# Patient Record
Sex: Male | Born: 1975 | Race: White | Hispanic: No | Marital: Married | Smoking: Never smoker
Health system: Southern US, Community
[De-identification: ages and names within clinical notes are randomized; demographics above are authoritative.]

## PROBLEM LIST (undated history)

## (undated) DIAGNOSIS — I10 Essential (primary) hypertension: Secondary | ICD-10-CM

## (undated) DIAGNOSIS — G4733 Obstructive sleep apnea (adult) (pediatric): Secondary | ICD-10-CM

## (undated) DIAGNOSIS — I509 Heart failure, unspecified: Secondary | ICD-10-CM

## (undated) HISTORY — PX: URETHRA SURGERY: SHX824

## (undated) HISTORY — PX: OTHER SURGICAL HISTORY: SHX169

## (undated) HISTORY — PX: TONSILLECTOMY: SUR1361

---

## 1999-08-05 ENCOUNTER — Encounter: Payer: Self-pay | Admitting: Family Medicine

## 1999-08-05 ENCOUNTER — Encounter: Admission: RE | Admit: 1999-08-05 | Discharge: 1999-08-05 | Payer: Self-pay | Admitting: Family Medicine

## 2001-11-18 ENCOUNTER — Emergency Department (HOSPITAL_COMMUNITY): Admission: EM | Admit: 2001-11-18 | Discharge: 2001-11-18 | Payer: Self-pay | Admitting: Emergency Medicine

## 2004-01-08 ENCOUNTER — Emergency Department (HOSPITAL_COMMUNITY): Admission: EM | Admit: 2004-01-08 | Discharge: 2004-01-08 | Payer: Self-pay | Admitting: Family Medicine

## 2004-06-13 ENCOUNTER — Emergency Department (HOSPITAL_COMMUNITY): Admission: EM | Admit: 2004-06-13 | Discharge: 2004-06-13 | Payer: Self-pay | Admitting: Family Medicine

## 2004-06-22 ENCOUNTER — Emergency Department (HOSPITAL_COMMUNITY): Admission: EM | Admit: 2004-06-22 | Discharge: 2004-06-22 | Payer: Self-pay | Admitting: Family Medicine

## 2007-05-22 ENCOUNTER — Emergency Department (HOSPITAL_COMMUNITY): Admission: EM | Admit: 2007-05-22 | Discharge: 2007-05-22 | Payer: Self-pay | Admitting: Emergency Medicine

## 2007-10-07 ENCOUNTER — Emergency Department (HOSPITAL_COMMUNITY): Admission: EM | Admit: 2007-10-07 | Discharge: 2007-10-07 | Payer: Self-pay | Admitting: Emergency Medicine

## 2008-10-23 ENCOUNTER — Emergency Department (HOSPITAL_COMMUNITY): Admission: EM | Admit: 2008-10-23 | Discharge: 2008-10-23 | Payer: Self-pay | Admitting: Family Medicine

## 2009-10-12 ENCOUNTER — Ambulatory Visit (HOSPITAL_COMMUNITY): Admission: RE | Admit: 2009-10-12 | Discharge: 2009-10-12 | Payer: Self-pay | Admitting: Cardiology

## 2017-04-28 DIAGNOSIS — R6882 Decreased libido: Secondary | ICD-10-CM | POA: Diagnosis not present

## 2017-04-28 DIAGNOSIS — I10 Essential (primary) hypertension: Secondary | ICD-10-CM | POA: Diagnosis not present

## 2017-05-07 DIAGNOSIS — L821 Other seborrheic keratosis: Secondary | ICD-10-CM | POA: Diagnosis not present

## 2017-05-07 DIAGNOSIS — B078 Other viral warts: Secondary | ICD-10-CM | POA: Diagnosis not present

## 2017-05-07 DIAGNOSIS — D1801 Hemangioma of skin and subcutaneous tissue: Secondary | ICD-10-CM | POA: Diagnosis not present

## 2017-05-07 DIAGNOSIS — D225 Melanocytic nevi of trunk: Secondary | ICD-10-CM | POA: Diagnosis not present

## 2017-05-12 DIAGNOSIS — I10 Essential (primary) hypertension: Secondary | ICD-10-CM | POA: Diagnosis not present

## 2017-05-28 ENCOUNTER — Emergency Department (HOSPITAL_BASED_OUTPATIENT_CLINIC_OR_DEPARTMENT_OTHER): Payer: 59

## 2017-05-28 ENCOUNTER — Other Ambulatory Visit: Payer: Self-pay

## 2017-05-28 ENCOUNTER — Emergency Department (HOSPITAL_BASED_OUTPATIENT_CLINIC_OR_DEPARTMENT_OTHER)
Admission: EM | Admit: 2017-05-28 | Discharge: 2017-05-28 | Disposition: A | Discharge status: Home / Self Care | Payer: 59 | Attending: Emergency Medicine | Admitting: Emergency Medicine

## 2017-05-28 ENCOUNTER — Encounter (HOSPITAL_BASED_OUTPATIENT_CLINIC_OR_DEPARTMENT_OTHER): Payer: Self-pay

## 2017-05-28 DIAGNOSIS — R002 Palpitations: Secondary | ICD-10-CM | POA: Insufficient documentation

## 2017-05-28 DIAGNOSIS — I10 Essential (primary) hypertension: Secondary | ICD-10-CM | POA: Insufficient documentation

## 2017-05-28 DIAGNOSIS — R2242 Localized swelling, mass and lump, left lower limb: Secondary | ICD-10-CM | POA: Insufficient documentation

## 2017-05-28 DIAGNOSIS — R600 Localized edema: Secondary | ICD-10-CM | POA: Diagnosis not present

## 2017-05-28 DIAGNOSIS — M7989 Other specified soft tissue disorders: Secondary | ICD-10-CM | POA: Diagnosis not present

## 2017-05-28 HISTORY — DX: Essential (primary) hypertension: I10

## 2017-05-28 NOTE — ED Provider Notes (Addendum)
I have personally seen and examined the patient. I have reviewed the documentation on PMH/FH/Soc Hx. I have discussed the plan of care with the resident and patient.  I have reviewed and agree with the resident's documentation. Please see associated encounter note.  Briefly, the patient is a 42 y.o. male here with LLE swelling. No trauma. No h/o DVT. No travel or surgery. No evidence of infection. Exam with BLE pitting edema, L>R.  [place] w/o DVT. The patient appears reasonably screened and/or stabilized for discharge and I doubt any other medical condition or other [facility] requiring further screening, evaluation, or treatment in the ED at this time prior to discharge. The patient is safe for discharge with strict return precautions.    EKG Interpretation None         Danene Montijo, Grayce Sessions, MD 05/28/17 [phone]

## 2017-05-28 NOTE — ED Notes (Signed)
Pt verbalizes understanding of dc instructions and denies any further needs at this time

## 2017-05-28 NOTE — Discharge Instructions (Addendum)
Please schedule a follow-up appointment with your primary doctor to discuss your leg swelling. In the meantime, please purchase a pair of compression stockings to wear at least while you are on your feet during the day. Elevating your feet at the end of the day can also be helpful to improve swelling.  If your swelling worsens or becomes painful, or if you develop chest pain or shortness of breath, please return to the emergency room.

## 2017-05-28 NOTE — ED Triage Notes (Signed)
C/o swelling to left LE x 2 days-denies injury-states he was sent by PCP to r/o DVT-NAD-steady gait

## 2017-05-28 NOTE — ED Provider Notes (Signed)
[place] EMERGENCY DEPARTMENT Provider Note   CSN: [id number] Arrival date & time: 05/28/17  1539  History   Chief Complaint Chief Complaint  Patient presents with  . Leg Swelling    HPI Justin Perez is a 42 y.o. male presenting with LLE swelling.  HPI   Patient presents with LLE swelling x2d. His wife noticed two nights ago that his L lower leg was very swollen compared the right. She said it was about twice the size of the R calf. He called his PCP, who recommended he be seen in the ED to rule out DVT. Says that swelling has improved somewhat today, but is still present. Did sleep with legs elevated last night which improved swelling some. Denies erythema of LE. Denies pain except when pain was especially swollen and pressure applied. Denies preceding trauma. No history of clots. No recent extended car or plane travel. Denies difficulty breathing or pain pain. Endorses occasional palpitations but says this is nothing new. Takes HCTZ but no other medications.   Past Medical History:  Diagnosis Date  . Hypertension     There are no active problems to display for this patient.   Past Surgical History:  Procedure Laterality Date  . TONSILLECTOMY    . URETHRA SURGERY          Home Medications    Prior to Admission medications   Not on File    Family History No family history on file.  Social History Social History   Tobacco Use  . Smoking status: Never Smoker  . Smokeless tobacco: Never Used  Substance Use Topics  . Alcohol use: Yes    Comment: occ  . Drug use: Never     Allergies   Patient has no known allergies.   Review of Systems Review of Systems  Respiratory: Negative for shortness of breath.   Cardiovascular: Positive for palpitations and leg swelling (LLE). Negative for chest pain.  Skin: Negative for color change.   Physical Exam Updated Vital Signs BP (!) 147/88 (BP Location: Right Arm)   Pulse 67   Temp 98.3 F (36.8 C)  (Oral)   Resp 18   Ht 6' (1.829 m)   Wt (!) 184.6 kg (407 lb)   SpO2 95%   BMI 55.20 kg/m   Physical Exam  Constitutional: He is oriented to person, place, and time. He appears well-developed and well-nourished. No distress.  HENT:  Head: Normocephalic and atraumatic.  Nose: Nose normal.  Mouth/Throat: Oropharynx is clear and moist. No oropharyngeal exudate.  Eyes: Pupils are equal, round, and reactive to light. Conjunctivae and EOM are normal. Right eye exhibits no discharge. Left eye exhibits no discharge.  Neck: Normal range of motion.  Cardiovascular: Normal rate, regular rhythm and normal heart sounds.  No murmur heard. Pulmonary/Chest: Effort normal and breath sounds normal. No respiratory distress. He has no wheezes.  Abdominal: Soft. Bowel sounds are normal. He exhibits no distension. There is no tenderness.  Musculoskeletal:  L calf increased in diameter compared to R, though not significantly. No erythema, TTP, or palpable cords. Homan's sign neg. 2+ pitting edema to midshin with 1+ to knee noted bilaterally. Woody skin consistent with chronic venous stasis changes noted bilaterally. 5/5 strength lower extremities.   Neurological: He is alert and oriented to person, place, and time.  Skin: Skin is warm and dry.  Psychiatric: He has a normal mood and affect. His behavior is normal.  Nursing note and vitals reviewed.   ED  Treatments / Results  Labs (all labs ordered are listed, but only abnormal results are displayed) Labs Reviewed - No data to display  EKG None  Radiology US Venous Img Lower Unilateral Left  Result Date: 05/28/2017 CLINICAL DATA:  Left lower extremity swelling x3 days EXAM: LEFT LOWER EXTREMITY VENOUS DOPPLER ULTRASOUND TECHNIQUE: Gray-scale sonography with graded compression, as well as color Doppler and duplex ultrasound were performed to evaluate the lower extremity deep venous systems from the level of the common femoral vein and including the  common femoral, femoral, profunda femoral, popliteal and calf veins including the posterior tibial, peroneal and gastrocnemius veins when visible. The superficial great saphenous vein was also interrogated. Spectral Doppler was utilized to evaluate flow at rest and with distal augmentation maneuvers in the common femoral, femoral and popliteal veins. COMPARISON:  None. FINDINGS: Contralateral Common Femoral Vein: Respiratory phasicity is normal and symmetric with the symptomatic side. No evidence of thrombus. Normal compressibility. Common Femoral Vein: No evidence of thrombus. Normal compressibility, respiratory phasicity and response to augmentation. Saphenofemoral Junction: No evidence of thrombus. Normal compressibility and flow on color Doppler imaging. Profunda Femoral Vein: No evidence of thrombus. Normal compressibility and flow on color Doppler imaging. Femoral Vein: No evidence of thrombus. Normal compressibility, respiratory phasicity and response to augmentation. Popliteal Vein: No evidence of thrombus. Normal compressibility, respiratory phasicity and response to augmentation. Calf Veins: No evidence of thrombus. Normal compressibility and flow on color Doppler imaging. Peroneal veins however not visualized. Superficial Great Saphenous Vein: No evidence of thrombus. Normal compressibility. Venous Reflux:  None. Other Findings:  None. IMPRESSION: No evidence of deep venous thrombosis. Electronically Signed   By: Ashley Royalty M.D.   On: 05/28/2017 18:07    Procedures Procedures (including critical care time)  Medications Ordered in ED Medications - No data to display   Initial Impression / Assessment and Plan / ED Course  I have reviewed the triage vital signs and the nursing notes.  Pertinent labs & imaging results that were available during my care of the patient were reviewed by me and considered in my medical decision making (see chart for details).     1752 Patient presenting with LLE  swelling with possible DVT. Venous stasis also included in differential, as patient with no risk factors for DVT, significant pitting edema bilaterally, neg Homan's sign, palpable cords, or TTP, and normal WOB. Initially tachypneic on presentation, however normal WOB on physical exam with normal RR and clear lungs bilaterally. Bilateral LE do have skin changes consistent with venous stasis, which could explain swelling with pitting edema. Would also explain improvement of sx with leg elevation. Will await read of lower extremity Doppler US.   1813 LE Doppler US resulted with no evidence of DVT. Swelling most likely 2/2 gravity-dependent edema/venous stasis. Will encourage patient to f/u with PCP. Return precautions discussed.   Final Clinical Impressions(s) / ED Diagnoses   Final diagnoses:  Leg swelling    ED Discharge Orders    None     Adin Hector, MD, MPH PGY-3 [facility] Pager [phone]    Verner Mould, MD 05/28/17 [phone]

## 2017-06-02 DIAGNOSIS — R208 Other disturbances of skin sensation: Secondary | ICD-10-CM | POA: Diagnosis not present

## 2017-06-02 DIAGNOSIS — M7989 Other specified soft tissue disorders: Secondary | ICD-10-CM | POA: Diagnosis not present

## 2017-06-02 DIAGNOSIS — I10 Essential (primary) hypertension: Secondary | ICD-10-CM | POA: Diagnosis not present

## 2017-06-17 DIAGNOSIS — G4733 Obstructive sleep apnea (adult) (pediatric): Secondary | ICD-10-CM | POA: Diagnosis not present

## 2017-07-07 DIAGNOSIS — G4733 Obstructive sleep apnea (adult) (pediatric): Secondary | ICD-10-CM | POA: Diagnosis not present

## 2017-07-14 DIAGNOSIS — G4733 Obstructive sleep apnea (adult) (pediatric): Secondary | ICD-10-CM | POA: Diagnosis not present

## 2017-08-14 DIAGNOSIS — G4733 Obstructive sleep apnea (adult) (pediatric): Secondary | ICD-10-CM | POA: Diagnosis not present

## 2017-09-08 DIAGNOSIS — G4733 Obstructive sleep apnea (adult) (pediatric): Secondary | ICD-10-CM | POA: Diagnosis not present

## 2017-09-13 DIAGNOSIS — G4733 Obstructive sleep apnea (adult) (pediatric): Secondary | ICD-10-CM | POA: Diagnosis not present

## 2017-10-07 DIAGNOSIS — G4733 Obstructive sleep apnea (adult) (pediatric): Secondary | ICD-10-CM | POA: Diagnosis not present

## 2017-10-14 DIAGNOSIS — G4733 Obstructive sleep apnea (adult) (pediatric): Secondary | ICD-10-CM | POA: Diagnosis not present

## 2017-11-14 DIAGNOSIS — G4733 Obstructive sleep apnea (adult) (pediatric): Secondary | ICD-10-CM | POA: Diagnosis not present

## 2017-12-14 DIAGNOSIS — G4733 Obstructive sleep apnea (adult) (pediatric): Secondary | ICD-10-CM | POA: Diagnosis not present

## 2018-01-14 DIAGNOSIS — G4733 Obstructive sleep apnea (adult) (pediatric): Secondary | ICD-10-CM | POA: Diagnosis not present

## 2018-02-13 DIAGNOSIS — G4733 Obstructive sleep apnea (adult) (pediatric): Secondary | ICD-10-CM | POA: Diagnosis not present

## 2018-03-16 DIAGNOSIS — G4733 Obstructive sleep apnea (adult) (pediatric): Secondary | ICD-10-CM | POA: Diagnosis not present

## 2018-03-23 DIAGNOSIS — M76822 Posterior tibial tendinitis, left leg: Secondary | ICD-10-CM | POA: Diagnosis not present

## 2018-03-23 DIAGNOSIS — M722 Plantar fascial fibromatosis: Secondary | ICD-10-CM | POA: Diagnosis not present

## 2018-03-23 DIAGNOSIS — M7732 Calcaneal spur, left foot: Secondary | ICD-10-CM | POA: Diagnosis not present

## 2018-03-30 DIAGNOSIS — M722 Plantar fascial fibromatosis: Secondary | ICD-10-CM | POA: Diagnosis not present

## 2018-04-16 DIAGNOSIS — G4733 Obstructive sleep apnea (adult) (pediatric): Secondary | ICD-10-CM | POA: Diagnosis not present

## 2018-07-14 ENCOUNTER — Emergency Department (HOSPITAL_COMMUNITY): Payer: 59

## 2018-07-14 ENCOUNTER — Inpatient Hospital Stay (HOSPITAL_COMMUNITY): Payer: 59

## 2018-07-14 ENCOUNTER — Encounter (HOSPITAL_COMMUNITY): Payer: Self-pay | Admitting: Internal Medicine

## 2018-07-14 ENCOUNTER — Inpatient Hospital Stay (HOSPITAL_COMMUNITY)
Admission: EM | Admit: 2018-07-14 | Discharge: 2018-07-17 | DRG: 304 | Disposition: A | Discharge status: Home / Self Care | Payer: 59 | Attending: Internal Medicine | Admitting: Internal Medicine

## 2018-07-14 DIAGNOSIS — Z833 Family history of diabetes mellitus: Secondary | ICD-10-CM

## 2018-07-14 DIAGNOSIS — Z8249 Family history of ischemic heart disease and other diseases of the circulatory system: Secondary | ICD-10-CM | POA: Diagnosis not present

## 2018-07-14 DIAGNOSIS — R739 Hyperglycemia, unspecified: Secondary | ICD-10-CM | POA: Diagnosis present

## 2018-07-14 DIAGNOSIS — Z9989 Dependence on other enabling machines and devices: Secondary | ICD-10-CM

## 2018-07-14 DIAGNOSIS — Z825 Family history of asthma and other chronic lower respiratory diseases: Secondary | ICD-10-CM | POA: Diagnosis not present

## 2018-07-14 DIAGNOSIS — R042 Hemoptysis: Secondary | ICD-10-CM | POA: Diagnosis present

## 2018-07-14 DIAGNOSIS — Z20828 Contact with and (suspected) exposure to other viral communicable diseases: Secondary | ICD-10-CM | POA: Diagnosis present

## 2018-07-14 DIAGNOSIS — I5033 Acute on chronic diastolic (congestive) heart failure: Secondary | ICD-10-CM | POA: Diagnosis present

## 2018-07-14 DIAGNOSIS — J9601 Acute respiratory failure with hypoxia: Secondary | ICD-10-CM | POA: Diagnosis present

## 2018-07-14 DIAGNOSIS — I110 Hypertensive heart disease with heart failure: Secondary | ICD-10-CM | POA: Diagnosis present

## 2018-07-14 DIAGNOSIS — R778 Other specified abnormalities of plasma proteins: Secondary | ICD-10-CM | POA: Diagnosis present

## 2018-07-14 DIAGNOSIS — J810 Acute pulmonary edema: Secondary | ICD-10-CM | POA: Diagnosis not present

## 2018-07-14 DIAGNOSIS — Z6841 Body Mass Index (BMI) 40.0 and over, adult: Secondary | ICD-10-CM | POA: Diagnosis not present

## 2018-07-14 DIAGNOSIS — G4733 Obstructive sleep apnea (adult) (pediatric): Secondary | ICD-10-CM | POA: Diagnosis present

## 2018-07-14 DIAGNOSIS — R7989 Other specified abnormal findings of blood chemistry: Secondary | ICD-10-CM | POA: Diagnosis present

## 2018-07-14 DIAGNOSIS — R079 Chest pain, unspecified: Secondary | ICD-10-CM | POA: Diagnosis not present

## 2018-07-14 DIAGNOSIS — R0602 Shortness of breath: Secondary | ICD-10-CM

## 2018-07-14 DIAGNOSIS — I160 Hypertensive urgency: Secondary | ICD-10-CM | POA: Diagnosis not present

## 2018-07-14 DIAGNOSIS — Z20822 Contact with and (suspected) exposure to covid-19: Secondary | ICD-10-CM | POA: Diagnosis present

## 2018-07-14 DIAGNOSIS — R6889 Other general symptoms and signs: Secondary | ICD-10-CM | POA: Diagnosis not present

## 2018-07-14 DIAGNOSIS — I10 Essential (primary) hypertension: Secondary | ICD-10-CM | POA: Diagnosis present

## 2018-07-14 DIAGNOSIS — J9600 Acute respiratory failure, unspecified whether with hypoxia or hypercapnia: Secondary | ICD-10-CM

## 2018-07-14 HISTORY — DX: Morbid (severe) obesity due to excess calories: E66.01

## 2018-07-14 HISTORY — DX: Obstructive sleep apnea (adult) (pediatric): G47.33

## 2018-07-14 LAB — BASIC METABOLIC PANEL
Anion gap: 11 (ref 5–15)
BUN: 14 mg/dL (ref 6–20)
CO2: 26 mmol/L (ref 22–32)
Calcium: 9.1 mg/dL (ref 8.9–10.3)
Chloride: 103 mmol/L (ref 98–111)
Creatinine, Ser: 1.11 mg/dL (ref 0.61–1.24)
GFR calc Af Amer: >60 mL/min (ref >60)
GFR calc non Af Amer: >60 mL/min (ref >60)
Glucose, Bld: 130 mg/dL — ABNORMAL HIGH (ref 70–99)
Potassium: 4.2 mmol/L (ref 3.5–5.1)
Sodium: 140 mmol/L (ref 135–145)

## 2018-07-14 LAB — CBC WITH DIFFERENTIAL/PLATELET
Abs Immature Granulocytes: 0.03 10*3/uL (ref 0.00–0.07)
Basophils Absolute: 0 10*3/uL (ref 0.0–0.1)
Basophils Relative: 0 %
Eosinophils Absolute: 0.2 10*3/uL (ref 0.0–0.5)
Eosinophils Relative: 2 %
HCT: 52.2 % — ABNORMAL HIGH (ref 39.0–52.0)
Hemoglobin: 17.5 g/dL — ABNORMAL HIGH (ref 13.0–17.0)
Immature Granulocytes: 0 %
Lymphocytes Relative: 29 %
Lymphs Abs: 2.7 10*3/uL (ref 0.7–4.0)
MCH: 31.3 pg (ref 26.0–34.0)
MCHC: 33.5 g/dL (ref 30.0–36.0)
MCV: 93.2 fL (ref 80.0–100.0)
Monocytes Absolute: 0.7 10*3/uL (ref 0.1–1.0)
Monocytes Relative: 7 %
Neutro Abs: 5.6 10*3/uL (ref 1.7–7.7)
Neutrophils Relative %: 62 %
Platelets: 223 10*3/uL (ref 150–400)
RBC: 5.6 MIL/uL (ref 4.22–5.81)
RDW: 13.6 % (ref 11.5–15.5)
WBC: 9.2 10*3/uL (ref 4.0–10.5)
nRBC: 0 % (ref 0.0–0.2)

## 2018-07-14 LAB — SARS CORONAVIRUS 2 BY RT PCR (HOSPITAL ORDER, PERFORMED IN ~~LOC~~ HOSPITAL LAB)
SARS Coronavirus 2: NEGATIVE
SARS Coronavirus 2: NEGATIVE

## 2018-07-14 LAB — HEMOGLOBIN A1C
Hgb A1c MFr Bld: 5.5 % (ref 4.8–5.6)
Mean Plasma Glucose: 111.15 mg/dL

## 2018-07-14 LAB — HEPATIC FUNCTION PANEL
ALT: 74 U/L — ABNORMAL HIGH (ref 0–44)
AST: 58 U/L — ABNORMAL HIGH (ref 15–41)
Albumin: 3.5 g/dL (ref 3.5–5.0)
Alkaline Phosphatase: 83 U/L (ref 38–126)
Bilirubin, Direct: 0.3 mg/dL — ABNORMAL HIGH (ref 0.0–0.2)
Indirect Bilirubin: 0.8 mg/dL (ref 0.3–0.9)
Total Bilirubin: 1.1 mg/dL (ref 0.3–1.2)
Total Protein: 6.1 g/dL — ABNORMAL LOW (ref 6.5–8.1)

## 2018-07-14 LAB — RAPID URINE DRUG SCREEN, HOSP PERFORMED
Amphetamines: NOT DETECTED
Barbiturates: NOT DETECTED
Benzodiazepines: NOT DETECTED
Cocaine: NOT DETECTED
Opiates: NOT DETECTED
Tetrahydrocannabinol: NOT DETECTED

## 2018-07-14 LAB — GLUCOSE, CAPILLARY
Glucose-Capillary: 126 mg/dL — ABNORMAL HIGH (ref 70–99)
Glucose-Capillary: 102 mg/dL — ABNORMAL HIGH (ref 70–99)

## 2018-07-14 LAB — TROPONIN I
Troponin I: 0.22 ng/mL (ref <0.03)
Troponin I: 0.18 ng/mL (ref <0.03)
Troponin I: 0.22 ng/mL (ref <0.03)

## 2018-07-14 LAB — BRAIN NATRIURETIC PEPTIDE: B Natriuretic Peptide: 255.2 pg/mL — ABNORMAL HIGH (ref 0.0–100.0)

## 2018-07-14 LAB — PROCALCITONIN: Procalcitonin: <0.1 ng/mL

## 2018-07-14 LAB — TSH: TSH: 1.497 u[IU]/mL (ref 0.350–4.500)

## 2018-07-14 LAB — LACTATE DEHYDROGENASE: LDH: 259 U/L — ABNORMAL HIGH (ref 98–192)

## 2018-07-14 LAB — FIBRINOGEN: Fibrinogen: 378 mg/dL (ref 210–475)

## 2018-07-14 LAB — CBG MONITORING, ED: Glucose-Capillary: 100 mg/dL — ABNORMAL HIGH (ref 70–99)

## 2018-07-14 LAB — FERRITIN: Ferritin: 120 ng/mL (ref 24–336)

## 2018-07-14 LAB — D-DIMER, QUANTITATIVE: D-Dimer, Quant: 0.88 ug/mL-FEU — ABNORMAL HIGH (ref 0.00–0.50)

## 2018-07-14 LAB — ECHOCARDIOGRAM COMPLETE

## 2018-07-14 LAB — C-REACTIVE PROTEIN: CRP: 1.4 mg/dL — ABNORMAL HIGH (ref <1.0)

## 2018-07-14 MED ORDER — ALBUTEROL SULFATE HFA 108 (90 BASE) MCG/ACT IN AERS
2.0000 | INHALATION_SPRAY | Freq: Once | RESPIRATORY_TRACT | Status: AC
  Administered 2018-07-14: 2 via RESPIRATORY_TRACT
  Filled 2018-07-14: qty 6.7

## 2018-07-14 MED ORDER — ONDANSETRON HCL 4 MG/2ML IJ SOLN
4.0000 mg | Freq: Four times a day (QID) | INTRAMUSCULAR | Status: DC | PRN
  Filled 2018-07-14: qty 2

## 2018-07-14 MED ORDER — ACETAMINOPHEN 325 MG PO TABS
650.0000 mg | ORAL_TABLET | ORAL | Status: DC | PRN
  Administered 2018-07-14 – 2018-07-15 (×5): 650 mg via ORAL
  Filled 2018-07-14 (×6): qty 2

## 2018-07-14 MED ORDER — SODIUM CHLORIDE 0.9 % IV SOLN
250.0000 mL | INTRAVENOUS | Status: DC | PRN
  Administered 2018-07-15: 250 mL via INTRAVENOUS

## 2018-07-14 MED ORDER — SODIUM CHLORIDE 0.9% FLUSH: 3.0000 mL | INTRAVENOUS | Status: DC | PRN

## 2018-07-14 MED ORDER — ASPIRIN 300 MG RE SUPP: 300.0000 mg | RECTAL | Status: AC

## 2018-07-14 MED ORDER — ATORVASTATIN CALCIUM 40 MG PO TABS
40.0000 mg | ORAL_TABLET | Freq: Every day | ORAL | Status: DC
  Administered 2018-07-14 – 2018-07-16 (×3): 40 mg via ORAL
  Filled 2018-07-14 (×3): qty 1

## 2018-07-14 MED ORDER — NITROGLYCERIN 0.4 MG SL SUBL: 0.4000 mg | SUBLINGUAL_TABLET | SUBLINGUAL | Status: DC | PRN

## 2018-07-14 MED ORDER — AEROCHAMBER PLUS FLO-VU LARGE MISC
1.0000 | Freq: Once | Status: AC
  Administered 2018-07-14: 08:00:00 1
  Filled 2018-07-14 (×3): qty 1

## 2018-07-14 MED ORDER — ENOXAPARIN SODIUM 40 MG/0.4ML ~~LOC~~ SOLN
40.0000 mg | SUBCUTANEOUS | Status: DC
  Administered 2018-07-14 – 2018-07-16 (×3): 40 mg via SUBCUTANEOUS
  Filled 2018-07-14 (×3): qty 0.4

## 2018-07-14 MED ORDER — ALBUTEROL SULFATE HFA 108 (90 BASE) MCG/ACT IN AERS
4.0000 | INHALATION_SPRAY | Freq: Once | RESPIRATORY_TRACT | Status: AC
  Administered 2018-07-14: 4 via RESPIRATORY_TRACT

## 2018-07-14 MED ORDER — IOHEXOL 350 MG/ML SOLN
100.0000 mL | Freq: Once | INTRAVENOUS | Status: AC | PRN
  Administered 2018-07-14: 100 mL via INTRAVENOUS

## 2018-07-14 MED ORDER — HYDRALAZINE HCL 20 MG/ML IJ SOLN
5.0000 mg | Freq: Once | INTRAMUSCULAR | Status: AC
  Administered 2018-07-14: 5 mg via INTRAVENOUS
  Filled 2018-07-14: qty 1

## 2018-07-14 MED ORDER — ASPIRIN 81 MG PO CHEW
324.0000 mg | CHEWABLE_TABLET | ORAL | Status: AC
  Administered 2018-07-14: 324 mg via ORAL
  Filled 2018-07-14: qty 4

## 2018-07-14 MED ORDER — SODIUM CHLORIDE 0.9% FLUSH
3.0000 mL | Freq: Two times a day (BID) | INTRAVENOUS | Status: DC
  Administered 2018-07-14 – 2018-07-17 (×5): 3 mL via INTRAVENOUS

## 2018-07-14 MED ORDER — PERFLUTREN LIPID MICROSPHERE
1.0000 mL | INTRAVENOUS | Status: AC | PRN
  Administered 2018-07-14: 4 mL via INTRAVENOUS
  Filled 2018-07-14: qty 10

## 2018-07-14 MED ORDER — INSULIN ASPART 100 UNIT/ML ~~LOC~~ SOLN
0.0000 [IU] | Freq: Three times a day (TID) | SUBCUTANEOUS | Status: DC
  Administered 2018-07-14: 2 [IU] via SUBCUTANEOUS

## 2018-07-14 MED ORDER — ASPIRIN EC 81 MG PO TBEC
81.0000 mg | DELAYED_RELEASE_TABLET | Freq: Every day | ORAL | Status: DC
  Administered 2018-07-15 – 2018-07-16 (×2): 81 mg via ORAL
  Filled 2018-07-14 (×2): qty 1

## 2018-07-14 MED ORDER — ALBUTEROL SULFATE HFA 108 (90 BASE) MCG/ACT IN AERS
4.0000 | INHALATION_SPRAY | RESPIRATORY_TRACT | Status: DC | PRN
  Administered 2018-07-14 – 2018-07-15 (×3): 4 via RESPIRATORY_TRACT
  Filled 2018-07-14: qty 6.7

## 2018-07-14 NOTE — Progress Notes (Signed)
  Echocardiogram 2D Echocardiogram has been performed.  Justin Perez 07/14/2018, 12:22 PM

## 2018-07-14 NOTE — ED Notes (Signed)
Per pt, he wasn't feeling so well around 0830 pm last night.  States acute onset sob that awoke him from his sleep.  He initially called 911, but then, after sitting upright, felt like he could take adequate breaths.  Denies fevers.  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

## 2018-07-14 NOTE — ED Notes (Signed)
Called Pharmacy for Aerochamber

## 2018-07-14 NOTE — ED Notes (Signed)
Called [facility], swab is in the process and "should not be too long."

## 2018-07-14 NOTE — Progress Notes (Signed)
Transferred to rm 15 per bed since pt is Covid neg x2. Report given earlier to [place] RN CN who is assuming care.

## 2018-07-14 NOTE — Progress Notes (Signed)
Msg sent to Dr. Baltazar Najjar pt's 2nd Covid test was negative. Dr. Baltazar Najjar at desk. Informed Danielle RN CN about the negative Covid test and the text to Dr.Kirby about the elevated BP.

## 2018-07-14 NOTE — Progress Notes (Signed)
Msg sent to Dr. Baltazar Najjar pt's BP is now SBP in  the 140's over low 100's x4 but around 1700 he was 129/66.

## 2018-07-14 NOTE — ED Notes (Signed)
Attempted report.  4E does not take covid r/o patients.  Bed control notified.

## 2018-07-14 NOTE — ED Notes (Signed)
PT stating increasing sob, though vs remain the same.  MD notified and at bedside.  MD aware of elevated troponin.

## 2018-07-14 NOTE — ED Notes (Signed)
Patient transported to CT 

## 2018-07-14 NOTE — H&P (Signed)
History and Physical    Justin Perez PXT:[id number] DOB: [birth year] DOA: 07/14/2018  PCP: London Pepper, MD Consultants:  None Patient coming from:  Home - lives with wife; NOK: Wife, [phone]  Chief Complaint: SOB  HPI: Justin Perez is a 43 y.o. male with medical history significant of HTN; OSA on CPAP; and morbid obesity (BMI 55) presenting with SOB.   He reports having left-sided chest tightness about a week ago that last for 10 minutes and resolved spontaneously; this was similar to chest pain about 10 years that resulted in a negative cath.  His symptoms returned yesterday and were not clearly exertional in nature.  His chest tightness was associated with SOB.  Overnight, he developed markedly increased WOB and was associated with cough and hemoptysis.  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  Initial O2 sats 77% on RA and so he was placed on NRB.  Prior to my evaluating the patient, he was complaining of increased SOB and was given 4 puffs of Albuterol with improvement.  He denies h/o smoking and denies h/o known lung disease.   ED Course:  "Positive he's got COVID" with negative test, very likely false negative.  Troponin 0.22, EKG not acute.  BNP 255.2.  O2 requirement, on NRB.    Review of Systems: As per HPI; otherwise review of systems reviewed and negative.   Ambulatory Status:  Ambulates without assistance  Past Medical History:  Diagnosis Date  . Hypertension   . Morbid obesity with BMI of 50.0-59.9, adult ([place])   . OSA on CPAP     Past Surgical History:  Procedure Laterality Date  . TONSILLECTOMY    . URETHRA SURGERY      Social History   Socioeconomic History  . Marital status: Married    Spouse name: Not on file  . Number of children: Not on file  . Years of education:  Not on file  . Highest education level: Not on file  Occupational History  . Not on file  Social Needs  . Financial resource strain: Not on file  . Food insecurity:    Worry: Not on file    Inability: Not on file  . Transportation needs:    Medical: Not on file    Non-medical: Not on file  Tobacco Use  . Smoking status: Never Smoker  . Smokeless tobacco: Never Used  Substance and Sexual Activity  . Alcohol use: Yes    Comment: occ  . Drug use: Never  . Sexual activity: Not on file  Lifestyle  . Physical activity:    Days per week: Not on file    Minutes per session: Not on file  . Stress: Not on file  Relationships  . Social connections:    Talks on phone: Not on file    Gets together: Not on file    Attends religious service: Not on file    Active member of club or organization: Not on file    Attends meetings of clubs or organizations: Not on file    Relationship status: Not on file  . Intimate partner violence:    Fear of current or ex partner: Not on file    Emotionally abused: Not on file    Physically abused: Not on file    Forced sexual activity: Not  on file  Other Topics Concern  . Not on file  Social History Narrative  . Not on file    No Known Allergies  Family History  Problem Relation Age of Onset  . Heart failure Mother 38  . Diabetes Mother   . COPD Father     Prior to Admission medications   Not on File    Physical Exam: Vitals:   07/14/18 0815 07/14/18 0830 07/14/18 0845 07/14/18 0900  BP: 95/61 (!) 90/51 117/74 (!) 94/52  Pulse: (!) 106 97 88 91  Resp: (!) 28 16 (!) 31 14  Temp:      TempSrc:      SpO2: 95% 96% 93% 96%     . General:  Appears calm and comfortable and is NAD on NRB O2 at 15L . Eyes:  PERRL, EOMI, normal lids, iris . ENT:  grossly normal hearing, lips & tongue, mmm; appropriate dentition . Neck:  no LAD, masses or thyromegaly . Cardiovascular:  RRR, no m/r/g. No LE edema.  Marland Kitchen Respiratory:   CTA bilaterally with  no wheezes/rales/rhonchi.  Normal respiratory effort. . Abdomen:  soft, NT, ND, NABS . Back:   normal alignment, no CVAT . Skin:  no rash or induration seen on limited exam . Musculoskeletal:  grossly normal tone BUE/BLE, good ROM, no bony abnormality . Lower extremity:  No LE edema.  Limited foot exam with no ulcerations.  2+ distal pulses. Marland Kitchen Psychiatric:  grossly normal mood and affect, speech fluent and appropriate, AOx3 . Neurologic:  CN 2-12 grossly intact, moves all extremities in coordinated fashion, sensation intact    Radiological Exams on Admission: Ct Angio Chest Pe W And/or Wo Contrast  Result Date: 07/14/2018 CLINICAL DATA:  Chest tightness.  PE suspected. EXAM: CT ANGIOGRAPHY CHEST WITH CONTRAST TECHNIQUE: Multidetector CT imaging of the chest was performed using the standard protocol during bolus administration of intravenous contrast. Multiplanar CT image reconstructions and MIPs were obtained to evaluate the vascular anatomy. CONTRAST:  157m OMNIPAQUE IOHEXOL 350 MG/ML SOLN COMPARISON:  Radiography from earlier today FINDINGS: Cardiovascular: Borderline heart size. No pericardial effusion. Negative for pulmonary embolism. No atherosclerotic calcification. Mediastinum/Nodes: Negative for adenopathy or mass. Lungs/Pleura: Bilateral airspace opacity with perihilar predilection. There is a degree of septal thickening best seen at the bases. Trace, if any, pleural fluid. Upper Abdomen: Negative Musculoskeletal: Spondylosis and disc narrowing. Review of the MIP images confirms the above findings. IMPRESSION: 1. Bilateral airspace disease and septal thickening which could be multifocal pneumonia or edema. 2. Negative for pulmonary embolism. Electronically Signed   By: JMonte FantasiaM.D.   On: 07/14/2018 07:38   Dg Chest Port 1 View  Result Date: 07/14/2018 CLINICAL DATA:  Shortness of breath and cough today EXAM: PORTABLE CHEST 1 VIEW COMPARISON:  05/22/2007 FINDINGS: Cardiac shadow is  mildly prominent but accentuated by the frontal technique. Some atelectatic changes are noted in the right base which improved somewhat on the second image. No other focal abnormality is noted. IMPRESSION: Mild right basilar atelectasis. Electronically Signed   By: MInez CatalinaM.D.   On: 07/14/2018 03:57    EKG: Independently reviewed.  Sinus tachycardia with rate 102; nonspecific ST changes with no evidence of acute ischemia   Labs on Admission: I have personally reviewed the available labs and imaging studies at the time of the admission.  Pertinent labs:   Glucose 130 BNP 255.2 Troponin 0.22 WBC 9.2 Hgb 17.5 COVID negative  Assessment/Plan Principal Problem:   Acute respiratory failure  with hypoxia [facility]) Active Problems:   Chest pain in adult   Suspected Covid-19 Virus Infection   Essential hypertension   Elevated troponin   Morbid obesity with BMI of 50.0-59.9, adult ([facility])   OSA on CPAP   Hyperglycemia   Acute respiratory failure with hypoxia, suspected COVID-19 infection -Patient with presenting with SOB and hemoptysis as well as left-sided chest tightness -Possible indirect contact for COVID with coworkers -He does not have a usual home O2 requirement and is currently requiring NRB [place] O2 -Because there is a possible COVID contact in this patient with a new O2 requirement, will consider at high risk for COVID at this time -The patient has comorbidities which may increase the risk for ARDS/MODS including:  HTN, obesity -Exam is concerning for development of ARDS/MODS due to respiratory distress requiring NRB O2 -Pertinent labs concerning for COVID include normal WBC count; other tests looking for COVID markers have been ordered -CXR with R basilar atelectasis; CT chest showed bilateral airspace disease and septal thickening which could be multifocal pneumonia or edema - clearly concerning for COVID infection -Will hold antibiotics for now, pending procalcitonin -Will admit  for further evaluation, close monitoring, and treatment -Monitor on telemetry x at least 24 hours -At this time, will attempt to avoid use of aerosolized medications and use HFAs instead -Will check daily labs including BMP with Mag, Phos; LFTs; CBC with differential -Will ask the patient to maintain an awake prone position for 16+ hours a day, if possible, with a minimum of 2-3 hours at a time -If the patient shows clinical deterioration, consider Solumedrol 40-80 mg IV q8h x 3 days and possibly transfer to ICU with PCCM consultation -Given high-risk for ARDS/MODS, will use high-flow [place] up to 15L in a negative pressure room for now and consider PCCM consultation if not improving -Possible therapeutic interventions include hydroxychloroquine; IL-6 inhibitor (Tocilizumab, 8 mg/kg IV x 1 dose); and possibly Azithromycin, vitamin C, zinc -Will attempt to maintain euvolemia to a net negative fluid status -If D-dimer <5, will use standard-dosed Lovenox for DVT prevention -If D-dimer >5, will use weight-based Lovenox prophylaxis (0.5 mg/kg q12h) -Patient was seen wearing full PPE including: gown, gloves, head cover, N95, and face shield; donning and doffing was in compliance with current standards.  Chest pain with elevated troponin, concerning for NSTEMI -Patient with multiple RF for ACS -Reports prior h/o chest pain about 10 years ago with negative cath at that time -This pain was similar to that pain and occurred 1 week ago briefly and then again overnight -EKG without clearly acute ischemia -Troponin elevated to 0.22 -Will continue to trend troponin -Will give ASA 325 mg PO now and then 81 mg PO daily -No beta blocker due to relative hypotension in the ER -Start empiric Lipitor 40 mg daily for now -Repeat EKG prn and in AM -Check stat Echo -Given concern for possible COVID infection, will attempt to limit exposure to the patient at this time - but I have notified [employer] about the patient  and will plan to have cardiology see him tomorrow unless troponin/echo requires more urgent evaluation -Will check A1c, FLP, TSH, and UDS  HTN -The Med Rec is still pending for this patient, but there is no apparent medication listed that he has been taking for this issue -Consider beta blocker when BP will tolerate  Morbid obesity -Needs nutrition consult prior to d/c -Needs significant encouragement at weight loss; suggest outpatient consideration of bariatric clinic and/or bariatric surgery consultation  OSA on CPAP -Will need CPAP added at night once ruled out for COVID  Hyperglycemia -Certainly, with his habitus he is at risk for DM -Will check A1c -Will start moderate-scale SSI    DVT prophylaxis:  Lovenox  Code Status:  Full - confirmed with patient Family Communication: None present; I have spoken to his wife by telephone Disposition Plan:  Home once clinically improved Consults called: None  Admission status: Admit - It is my clinical opinion that admission to INPATIENT is reasonable and necessary because of the expectation that this patient will require hospital care that crosses at least 2 midnights to treat this condition based on the medical complexity of the problems presented.  Given the aforementioned information, the predictability of an adverse outcome is felt to be significant.     Karmen Bongo MD [facility]   How to contact the [facility] Attending or Consulting provider [place] or covering provider during after hours [place], for this patient?  1. Check the care team in [facility] and look for a) attending/consulting [facility] provider listed and b) the [facility] team listed 2. Log into [email or web address] and use [place]'s universal password to access. If you do not have the password, please contact the hospital operator. 3. Locate the [facility] provider you are looking for under [facility] and page to a number that you can be directly reached. 4. If you still have  difficulty reaching the provider, please page the [facility] (Director on Call) for the Hospitalists listed on amion for assistance.   07/14/2018, 10:33 AM

## 2018-07-14 NOTE — Progress Notes (Signed)
When I entered rm to assess pt and retake his bld pressure he had his NRB mask off and sats were 98% and in no apparent distress. Oxygen placed on 4 L/M [place] and pt's oxygen level greater than 98%. Will continue to try to wean pt off oxygen

## 2018-07-14 NOTE — ED Notes (Signed)
ED TO INPATIENT HANDOFF REPORT  ED Nurse Name and Phone #:  Hassan Rowan [phone]  S Name/Age/Gender Justin Perez 43 y.o. male Room/Bed: RESUSC/RESUSC  Code Status   Code Status: Full Code  Home/SNF/Other Home Patient oriented to: self, place, time and situation Is this baseline? Yes   Triage Complete: Triage complete  Chief Complaint shob  Triage Note No notes on file   Allergies No Known Allergies  Level of Care/Admitting Diagnosis ED Disposition    ED Disposition Condition [place] [facility] Area: [place] [100100]  Level of Care: Progressive [102]  Covid Evaluation: Person Under Investigation (PUI)  Isolation Risk Level: High Risk/Airborne (Aerosolizing procedure, nebulizer, intubated/ventilation, CPAP/BiPAP)  Diagnosis: Acute respiratory failure with hypoxia [facility]) [[id number]]  Admitting Physician: Karmen Bongo [[id number]]  Attending Physician: Karmen Bongo [[id number]]  Estimated length of stay: 3 - 4 days  Certification:: I certify this patient will need inpatient services for at least 2 midnights  PT Class (Do Not Modify): Inpatient [101]  PT Acc Code (Do Not Modify): Private [1]       B Medical/Surgery History Past Medical History:  Diagnosis Date  . Hypertension   . Morbid obesity with BMI of 50.0-59.9, adult ([place])   . OSA on CPAP    Past Surgical History:  Procedure Laterality Date  . TONSILLECTOMY    . URETHRA SURGERY       A IV Location/Drains/Wounds Patient Lines/Drains/Airways Status   Active Line/Drains/Airways    Name:   Placement date:   Placement time:   Site:   Days:   Peripheral IV 07/14/18 Right Antecubital   07/14/18    0349    Antecubital   less than 1          Intake/Output Last 24 hours No intake or output data in the 24 hours ending 07/14/18 1046  Labs/Imaging Results for orders placed or performed during the hospital encounter of 07/14/18 (from the past 48 hour(s))  Basic metabolic panel     Status:  Abnormal   Collection Time: 07/14/18  3:55 AM  Result Value Ref Range   Sodium 140 135 - 145 mmol/L   Potassium 4.2 3.5 - 5.1 mmol/L   Chloride 103 98 - 111 mmol/L   CO2 26 22 - 32 mmol/L   Glucose, Bld 130 (H) 70 - 99 mg/dL   BUN 14 6 - 20 mg/dL   Creatinine, Ser 1.11 0.61 - 1.24 mg/dL   Calcium 9.1 8.9 - 10.3 mg/dL   GFR calc non Af Amer >60 >60 mL/min   GFR calc Af Amer >60 >60 mL/min   Anion gap 11 5 - 15    Comment: Performed at [facility], [place] [address] [place] [address]  CBC with Differential/Platelet     Status: Abnormal   Collection Time: 07/14/18  3:55 AM  Result Value Ref Range   WBC 9.2 4.0 - 10.5 K/uL   RBC 5.60 4.22 - 5.81 MIL/uL   Hemoglobin 17.5 (H) 13.0 - 17.0 g/dL   HCT 52.2 (H) 39.0 - 52.0 %   MCV 93.2 80.0 - 100.0 fL   MCH 31.3 26.0 - 34.0 pg   MCHC 33.5 30.0 - 36.0 g/dL   RDW 13.6 11.5 - 15.5 %   Platelets 223 150 - 400 K/uL   nRBC 0.0 0.0 - 0.2 %   Neutrophils Relative % 62 %   Neutro Abs 5.6 1.7 - 7.7 K/uL   Lymphocytes Relative 29 %  Lymphs Abs 2.7 0.7 - 4.0 K/uL   Monocytes Relative 7 %   Monocytes Absolute 0.7 0.1 - 1.0 K/uL   Eosinophils Relative 2 %   Eosinophils Absolute 0.2 0.0 - 0.5 K/uL   Basophils Relative 0 %   Basophils Absolute 0.0 0.0 - 0.1 K/uL   Immature Granulocytes 0 %   Abs Immature Granulocytes 0.03 0.00 - 0.07 K/uL    Comment: Performed at [facility], [place] [address] [place], [place] [address]  Brain natriuretic peptide     Status: Abnormal   Collection Time: 07/14/18  3:55 AM  Result Value Ref Range   B Natriuretic Peptide 255.2 (H) 0.0 - 100.0 pg/mL    Comment: Performed at [facility] [address], [place], [place] [address]  SARS Coronavirus 2 (CEPHEID - Performed in [facility] hospital lab), Hosp Order     Status: None   Collection Time: 07/14/18  4:45 AM  Result Value Ref Range   SARS Coronavirus 2 NEGATIVE NEGATIVE    Comment: (NOTE) If result is NEGATIVE SARS-CoV-2 target  nucleic acids are NOT DETECTED. The SARS-CoV-2 RNA is generally detectable in upper and lower  respiratory specimens during the acute phase of infection. The lowest  concentration of SARS-CoV-2 viral copies this assay can detect is 250  copies / mL. A negative result does not preclude SARS-CoV-2 infection  and should not be used as the sole basis for treatment or other  patient management decisions.  A negative result may occur with  improper specimen collection / handling, submission of specimen other  than nasopharyngeal swab, presence of viral mutation(s) within the  areas targeted by this assay, and inadequate number of viral copies  (<250 copies / mL). A negative result must be combined with clinical  observations, patient history, and epidemiological information. If result is POSITIVE SARS-CoV-2 target nucleic acids are DETECTED. The SARS-CoV-2 RNA is generally detectable in upper and lower  respiratory specimens dur ing the acute phase of infection.  Positive  results are indicative of active infection with SARS-CoV-2.  Clinical  correlation with patient history and other diagnostic information is  necessary to determine patient infection status.  Positive results do  not rule out bacterial infection or co-infection with other viruses. If result is PRESUMPTIVE POSTIVE SARS-CoV-2 nucleic acids MAY BE PRESENT.   A presumptive positive result was obtained on the submitted specimen  and confirmed on repeat testing.  While 2019 novel coronavirus  (SARS-CoV-2) nucleic acids may be present in the submitted sample  additional confirmatory testing may be necessary for epidemiological  and / or clinical management purposes  to differentiate between  SARS-CoV-2 and other Sarbecovirus currently known to infect humans.  If clinically indicated additional testing with an alternate test  methodology [phone]) is advised. The SARS-CoV-2 RNA is generally  detectable in upper and lower  respiratory sp ecimens during the acute  phase of infection. The expected result is Negative. Fact Sheet for Patients:  StrictlyIdeas.no Fact Sheet for Healthcare Providers: BankingDealers.co.za This test is not yet approved or cleared by the [place] FDA and has been authorized for detection and/or diagnosis of SARS-CoV-2 by FDA under an Emergency Use Authorization (EUA).  This EUA will remain in effect (meaning this test can be used) for the duration of the COVID-19 declaration under Section 564(b)(1) of the Act, 21 U.S.C. section 360bbb-3(b)(1), unless the authorization is terminated or revoked sooner. Performed at [facility], [place] [address], [place], [place]  [address]   Troponin I - ONCE - STAT     Status: Abnormal   Collection Time: 07/14/18  6:35 AM  Result Value Ref Range   Troponin I 0.22 (HH) <0.03 ng/mL    Comment: CRITICAL RESULT CALLED TO, READ BACK BY AND VERIFIED WITH: N.KOONTZ,RN [id number] 07/14/2018 CLARK,S Performed at [facility], [place] [address], [place], [place] [address]   Hemoglobin A1c     Status: None   Collection Time: 07/14/18 10:29 AM  Result Value Ref Range   Hgb A1c MFr Bld 5.5 4.8 - 5.6 %    Comment: (NOTE) Pre diabetes:          5.7%-6.4% Diabetes:              >6.4% Glycemic control for   <7.0% adults with diabetes    Mean Plasma Glucose 111.15 mg/dL    Comment: Performed at [facility] [address] [place], [place] [address]   Ct Angio Chest Pe W And/or Wo Contrast  Result Date: 07/14/2018 CLINICAL DATA:  Chest tightness.  PE suspected. EXAM: CT ANGIOGRAPHY CHEST WITH CONTRAST TECHNIQUE: Multidetector CT imaging of the chest was performed using the standard protocol during bolus administration of intravenous contrast. Multiplanar CT image reconstructions and MIPs were obtained to evaluate the vascular anatomy. CONTRAST:  179m OMNIPAQUE IOHEXOL 350 MG/ML SOLN COMPARISON:   Radiography from earlier today FINDINGS: Cardiovascular: Borderline heart size. No pericardial effusion. Negative for pulmonary embolism. No atherosclerotic calcification. Mediastinum/Nodes: Negative for adenopathy or mass. Lungs/Pleura: Bilateral airspace opacity with perihilar predilection. There is a degree of septal thickening best seen at the bases. Trace, if any, pleural fluid. Upper Abdomen: Negative Musculoskeletal: Spondylosis and disc narrowing. Review of the MIP images confirms the above findings. IMPRESSION: 1. Bilateral airspace disease and septal thickening which could be multifocal pneumonia or edema. 2. Negative for pulmonary embolism. Electronically Signed   By: JMonte FantasiaM.D.   On: 07/14/2018 07:38   Dg Chest Port 1 View  Result Date: 07/14/2018 CLINICAL DATA:  Shortness of breath and cough today EXAM: PORTABLE CHEST 1 VIEW COMPARISON:  05/22/2007 FINDINGS: Cardiac shadow is mildly prominent but accentuated by the frontal technique. Some atelectatic changes are noted in the right base which improved somewhat on the second image. No other focal abnormality is noted. IMPRESSION: Mild right basilar atelectasis. Electronically Signed   By: MInez CatalinaM.D.   On: 07/14/2018 03:57    Pending Labs Unresulted Labs (From admission, onward)    Start     Ordered   07/15/18 0500  [facility]avirus [facility]  (No[facility] Coron[facility], [facility]  Tomorrow morning,   R    Question:  Patient immune status  Answer:  Normal   07/14/18 1002   07/15/18 0500  CBC with Differential/Platelet  Daily,   R     07/14/18 1002   07/15/18 0500  Comprehensive metabolic panel  Daily,   R     07/14/18 1002   07/15/18 0500  Magnesium  Daily,   R     07/14/18 1002   07/15/18 0500  Phosphorus  Daily,   R     07/14/18 1002   07/15/18 0500  Lipid panel  Tomorrow morning,   R     07/14/18 1002   07/14/18 1100  Troponin I - Now Then Q6H  Now then every 6  hours,   R     07/14/18 1002  07/14/18 1028  Hepatic function panel  Once,   R     07/14/18 1027   07/14/18 0959  Urine rapid drug screen (hosp performed)  ONCE - STAT,   R     07/14/18 1002   07/14/18 0959  TSH  Once,   R     07/14/18 1002   07/14/18 0958  D-dimer, quantitative (not at [facility])  Once,   R     07/14/18 1002   07/14/18 0958  Ferritin  Once,   R     07/14/18 1002   07/14/18 0958  Fibrinogen  Once,   R     07/14/18 1002   07/14/18 0958  Lactate dehydrogenase  Once,   R     07/14/18 1002   07/14/18 0958  Procalcitonin  Once,   R     07/14/18 1002   07/14/18 0957  C-reactive protein  Once,   R     07/14/18 1002          Vitals/Pain Today's Vitals   07/14/18 0815 07/14/18 0830 07/14/18 0845 07/14/18 0900  BP: 95/61 (!) 90/51 117/74 (!) 94/52  Pulse: (!) 106 97 88 91  Resp: (!) 28 16 (!) 31 14  Temp:      TempSrc:      SpO2: 95% 96% 93% 96%  PainSc:        Isolation Precautions Airborne and Contact precautions  Medications Medications  albuterol (VENTOLIN HFA) 108 (90 Base) MCG/ACT inhaler 4 puff (has no administration in time range)  insulin aspart (novoLOG) injection 0-15 Units (has no administration in time range)  enoxaparin (LOVENOX) injection 40 mg (has no administration in time range)  sodium chloride flush (NS) 0.9 % injection 3 mL (has no administration in time range)  sodium chloride flush (NS) 0.9 % injection 3 mL (has no administration in time range)  0.9 %  sodium chloride infusion (has no administration in time range)  aspirin EC tablet 81 mg (has no administration in time range)  nitroGLYCERIN (NITROSTAT) SL tablet 0.4 mg (has no administration in time range)  atorvastatin (LIPITOR) tablet 40 mg (has no administration in time range)  acetaminophen (TYLENOL) tablet 650 mg (has no administration in time range)  ondansetron (ZOFRAN) injection 4 mg (has no administration in time range)  aspirin chewable tablet 324 mg (has no administration in  time range)    Or  aspirin suppository 300 mg (has no administration in time range)  albuterol (VENTOLIN HFA) 108 (90 Base) MCG/ACT inhaler 2 puff (2 puffs Inhalation Given 07/14/18 0613)  AeroChamber Plus Flo-Vu Large MISC 1 each (1 each Other Given 07/14/18 0816)  iohexol (OMNIPAQUE) 350 MG/ML injection 100 mL (100 mLs Intravenous Contrast Given 07/14/18 0718)  albuterol (VENTOLIN HFA) 108 (90 Base) MCG/ACT inhaler 4 puff (4 puffs Inhalation Given 07/14/18 0817)    Mobility walks     Focused Assessments Pulmonary Assessment Handoff:  Lung sounds: Bilateral Breath Sounds: Rhonchi O2 Device: NRB        R Recommendations: See Admitting Provider Note  Report given to:   Additional Notes:

## 2018-07-14 NOTE — ED Notes (Signed)
Updated pt on delay, he is going to text his wife and update her.

## 2018-07-14 NOTE — ED Notes (Signed)
Please call wife, Bardia Wangerin, with updates.  [phone].

## 2018-07-14 NOTE — ED Provider Notes (Signed)
Care assumed from Dr. Christy Gentles at shift change.  Patient presenting here with complaints of chest discomfort and shortness of breath.  This is worsened over the past 2 days, but became significantly worse yesterday evening.  Patient signed out to me awaiting results of a CT scan of the chest to rule out pulmonary embolism.  This study was negative for PE, but did show findings of multifocal airspace disease.  In my opinion, this is highly suspicious for COVID-19 and I feel as though the nasal swab is likely a false negative.  Patient continues with tachycardia and oxygen requirement.  He is currently on a nonrebreather.  He will be admitted to the hospitalist service.  I have spoken with Dr. Lorin Mercy who agrees to admit.  SLY PARLEE was evaluated in Emergency Department on 07/14/2018 for the symptoms described in the history of present illness. He was evaluated in the context of the global COVID-19 pandemic, which necessitated consideration that the patient might be at risk for infection with the SARS-CoV-2 virus that causes COVID-19. Institutional protocols and algorithms that pertain to the evaluation of patients at risk for COVID-19 are in a state of rapid change based on information released by regulatory bodies including the CDC and federal and state organizations. These policies and algorithms were followed during the patient's care in the ED.  Veryl Speak, MD 07/14/18 [phone]

## 2018-07-14 NOTE — ED Notes (Signed)
Spoke with pt and updated her.

## 2018-07-14 NOTE — ED Notes (Signed)
Pt states improved sob.

## 2018-07-14 NOTE — Progress Notes (Signed)
Msg sent to Dr. Baltazar Najjar pt's BP now 159/111

## 2018-07-14 NOTE — Progress Notes (Signed)
msg sent to Dr.Kirby pt's Troponin now increasing again. Was 0.18 now back up to 0.22. Order written earlier for pt to be taken off isolation and to be transferred to non Covid side. Danielle RN CN aware. Pt to move to rm 15 when ready

## 2018-07-14 NOTE — ED Notes (Signed)
Called pharmacy, Aerochamber in route

## 2018-07-14 NOTE — ED Provider Notes (Signed)
[facility] EMERGENCY DEPARTMENT Provider Note   CSN: [id number] Arrival date & time: 07/14/18  0330    History   Chief Complaint Chief Complaint  Patient presents with  . Shortness of Breath   Level 5 caveat due to acuity of condition HPI Justin Perez is a 43 y.o. male.     The history is provided by the patient. The history is limited by the condition of the patient.  Shortness of Breath  Severity:  Severe Onset quality:  Sudden Timing:  Constant Progression:  Worsening Chronicity:  New Relieved by:  None tried Worsened by:  Nothing Associated symptoms: hemoptysis and sputum production   Associated symptoms: no chest pain and no fever   Patient reports abrupt onset of cough/shortness of breath/hemoptysis.  He reports his been otherwise well.  No fever.  He has never had this before. No other details are known on arrival Past Medical History:  Diagnosis Date  . Hypertension     There are no active problems to display for this patient.   Past Surgical History:  Procedure Laterality Date  . TONSILLECTOMY    . URETHRA SURGERY          Home Medications    Prior to Admission medications   Not on File    Family History No family history on file.  Social History Social History   Tobacco Use  . Smoking status: Never Smoker  . Smokeless tobacco: Never Used  Substance Use Topics  . Alcohol use: Yes    Comment: occ  . Drug use: Never     Allergies   Patient has no known allergies.   Review of Systems Review of Systems  Unable to perform ROS: Acuity of condition  Constitutional: Negative for fever.  Respiratory: Positive for hemoptysis, sputum production and shortness of breath.   Cardiovascular: Negative for chest pain.     Physical Exam Updated Vital Signs BP (!) 181/121 (BP Location: Right Arm)   Pulse 100   Temp 98.4 F (36.9 C) (Oral)   Resp (!) 22   SpO2 94%   Physical Exam CONSTITUTIONAL: Well  developed/well nourished, distress noted HEAD: Normocephalic/atraumatic EYES: EOMI/PERRL ENMT: Mucous membranes moist NECK: supple no meningeal signs SPINE/BACK:entire spine nontender CV: S1/S2 noted, tachycardic LUNGS: tachypneic and coarse BS noted bilaterally ABDOMEN: soft, nontender,obese GU:no cva tenderness NEURO: Pt is awake/alert/appropriate, moves all extremitiesx4.  No facial droop.   EXTREMITIES: pulses normal/equal, full ROM, edema noted to LE SKIN: warm, color normal PSYCH: anxious  ED Treatments / Results  Labs (all labs ordered are listed, but only abnormal results are displayed) Labs Reviewed  BASIC METABOLIC PANEL - Abnormal; Notable for the following components:      Result Value   Glucose, Bld 130 (*)    All other components within normal limits  CBC WITH DIFFERENTIAL/PLATELET - Abnormal; Notable for the following components:   Hemoglobin 17.5 (*)    HCT 52.2 (*)    All other components within normal limits  BRAIN NATRIURETIC PEPTIDE - Abnormal; Notable for the following components:   B Natriuretic Peptide 255.2 (*)    All other components within normal limits  SARS CORONAVIRUS 2 (HOSPITAL ORDER, [facility])  TROPONIN I    EKG EKG Interpretation  Date/Time:  Wednesday Jul 14 2018 03:33:22 EDT Ventricular Rate:  102 PR Interval:  152 QRS Duration: 98 QT Interval:  346 QTC Calculation: 450 R Axis:   73 Text Interpretation:  Sinus  tachycardia Possible Left atrial enlargement No previous ECGs available Abnormal ekg Confirmed by Ripley Fraise ([id number]) on 07/14/2018 3:55:23 AM   Radiology Dg Chest Port 1 View  Result Date: 07/14/2018 CLINICAL DATA:  Shortness of breath and cough today EXAM: PORTABLE CHEST 1 VIEW COMPARISON:  05/22/2007 FINDINGS: Cardiac shadow is mildly prominent but accentuated by the frontal technique. Some atelectatic changes are noted in the right base which improved somewhat on the second image. No other  focal abnormality is noted. IMPRESSION: Mild right basilar atelectasis. Electronically Signed   By: Inez Catalina M.D.   On: 07/14/2018 03:57    Procedures Procedures   CRITICAL CARE Performed by: Sharyon Cable Total critical care time: 33 minutes Critical care time was exclusive of separately billable procedures and treating other patients. Critical care was necessary to treat or prevent imminent or life-threatening deterioration. Critical care was time spent personally by me on the following activities: development of treatment plan with patient and/or surrogate as well as nursing, discussions with consultants, evaluation of patient's response to treatment, examination of patient, obtaining history from patient or surrogate, ordering and performing treatments and interventions, ordering and review of laboratory studies, ordering and review of radiographic studies, pulse oximetry and re-evaluation of patient's condition.  Medications Ordered in ED Medications  AeroChamber Plus Flo-Vu Large MISC 1 each (has no administration in time range)  albuterol (VENTOLIN HFA) 108 (90 Base) MCG/ACT inhaler 2 puff (2 puffs Inhalation Given 07/14/18 2633)     Initial Impression / Assessment and Plan / ED Course  I have reviewed the triage vital signs and the nursing notes.  Pertinent labs & imaging results that were available during my care of the patient were reviewed by me and considered in my medical decision making (see chart for details).        4:47 AM Patient was seen soon after arrival for shortness of breath.  He has an oxygen requirement.  He reports this all began abruptly with cough and hemoptysis.  He had otherwise been well prior to this issue  he appears ill at this time.  Work-up has been initiated.  He may require CT chest. 6:13 AM Initial COVID screen negative.  Will obtain CT chest to rule out PE.  Patient is still oxygen dependent  Patient reports recent chest tightness, but  none at this time He reports he actually had increasing SOB over past day, it became abruptly worse after going to sleep and woke up SOB/with hemoptysis 7:03 AM Plan at signout to Dr. Stark Jock, f/u on CT imaging/troponin, likely admit   Justin Perez was evaluated in Emergency Department on 07/14/2018 for the symptoms described in the history of present illness. He was evaluated in the context of the global COVID-19 pandemic, which necessitated consideration that the patient might be at risk for infection with the SARS-CoV-2 virus that causes COVID-19. Institutional protocols and algorithms that pertain to the evaluation of patients at risk for COVID-19 are in a state of rapid change based on information released by regulatory bodies including the CDC and federal and state organizations. These policies and algorithms were followed during the patient's care in the ED.  Final Clinical Impressions(s) / ED Diagnoses   Final diagnoses:  SOB (shortness of breath)    ED Discharge Orders    None       Ripley Fraise, MD 07/14/18 [phone]

## 2018-07-14 NOTE — ED Notes (Signed)
Attempted to wean pt off O2, not successful.  Put back on non-rebreather.  Provider aware.

## 2018-07-15 ENCOUNTER — Encounter (HOSPITAL_COMMUNITY): Payer: Self-pay | Admitting: Cardiology

## 2018-07-15 ENCOUNTER — Other Ambulatory Visit: Payer: Self-pay

## 2018-07-15 DIAGNOSIS — Z6841 Body Mass Index (BMI) 40.0 and over, adult: Secondary | ICD-10-CM

## 2018-07-15 DIAGNOSIS — G4733 Obstructive sleep apnea (adult) (pediatric): Secondary | ICD-10-CM

## 2018-07-15 DIAGNOSIS — Z9989 Dependence on other enabling machines and devices: Secondary | ICD-10-CM

## 2018-07-15 DIAGNOSIS — R6889 Other general symptoms and signs: Secondary | ICD-10-CM

## 2018-07-15 DIAGNOSIS — R079 Chest pain, unspecified: Secondary | ICD-10-CM

## 2018-07-15 DIAGNOSIS — J9601 Acute respiratory failure with hypoxia: Secondary | ICD-10-CM

## 2018-07-15 LAB — COMPREHENSIVE METABOLIC PANEL
ALT: 53 U/L — ABNORMAL HIGH (ref 0–44)
AST: 43 U/L — ABNORMAL HIGH (ref 15–41)
Albumin: 3.3 g/dL — ABNORMAL LOW (ref 3.5–5.0)
Alkaline Phosphatase: 69 U/L (ref 38–126)
Anion gap: 10 (ref 5–15)
BUN: 10 mg/dL (ref 6–20)
CO2: 28 mmol/L (ref 22–32)
Calcium: 8.8 mg/dL — ABNORMAL LOW (ref 8.9–10.3)
Chloride: 103 mmol/L (ref 98–111)
Creatinine, Ser: 1 mg/dL (ref 0.61–1.24)
GFR calc Af Amer: >60 mL/min (ref >60)
GFR calc non Af Amer: >60 mL/min (ref >60)
Glucose, Bld: 106 mg/dL — ABNORMAL HIGH (ref 70–99)
Potassium: 3.6 mmol/L (ref 3.5–5.1)
Sodium: 141 mmol/L (ref 135–145)
Total Bilirubin: 1.4 mg/dL — ABNORMAL HIGH (ref 0.3–1.2)
Total Protein: 6.1 g/dL — ABNORMAL LOW (ref 6.5–8.1)

## 2018-07-15 LAB — LIPID PANEL
Cholesterol: 155 mg/dL (ref 0–200)
HDL: 32 mg/dL — ABNORMAL LOW (ref >40)
LDL Cholesterol: 104 mg/dL — ABNORMAL HIGH (ref 0–99)
Total CHOL/HDL Ratio: 4.8 RATIO
Triglycerides: 95 mg/dL (ref <150)
VLDL: 19 mg/dL (ref 0–40)

## 2018-07-15 LAB — RESPIRATORY PANEL BY PCR

## 2018-07-15 LAB — CBC WITH DIFFERENTIAL/PLATELET
Abs Immature Granulocytes: 0.03 10*3/uL (ref 0.00–0.07)
Basophils Absolute: 0 10*3/uL (ref 0.0–0.1)
Basophils Relative: 0 %
Eosinophils Absolute: 0.2 10*3/uL (ref 0.0–0.5)
Eosinophils Relative: 2 %
HCT: 45.4 % (ref 39.0–52.0)
Hemoglobin: 15.4 g/dL (ref 13.0–17.0)
Immature Granulocytes: 0 %
Lymphocytes Relative: 26 %
Lymphs Abs: 2 10*3/uL (ref 0.7–4.0)
MCH: 31.3 pg (ref 26.0–34.0)
MCHC: 33.9 g/dL (ref 30.0–36.0)
MCV: 92.3 fL (ref 80.0–100.0)
Monocytes Absolute: 0.6 10*3/uL (ref 0.1–1.0)
Monocytes Relative: 8 %
Neutro Abs: 4.9 10*3/uL (ref 1.7–7.7)
Neutrophils Relative %: 64 %
Platelets: 169 10*3/uL (ref 150–400)
RBC: 4.92 MIL/uL (ref 4.22–5.81)
RDW: 13.6 % (ref 11.5–15.5)
WBC: 7.6 10*3/uL (ref 4.0–10.5)
nRBC: 0 % (ref 0.0–0.2)

## 2018-07-15 LAB — TROPONIN I: Troponin I: 0.19 ng/mL (ref <0.03)

## 2018-07-15 LAB — GLUCOSE, CAPILLARY
Glucose-Capillary: 112 mg/dL — ABNORMAL HIGH (ref 70–99)
Glucose-Capillary: 93 mg/dL (ref 70–99)
Glucose-Capillary: 92 mg/dL (ref 70–99)
Glucose-Capillary: 105 mg/dL — ABNORMAL HIGH (ref 70–99)

## 2018-07-15 LAB — PHOSPHORUS: Phosphorus: 3.7 mg/dL (ref 2.5–4.6)

## 2018-07-15 LAB — MAGNESIUM: Magnesium: 1.8 mg/dL (ref 1.7–2.4)

## 2018-07-15 LAB — MRSA PCR SCREENING: MRSA by PCR: NEGATIVE

## 2018-07-15 LAB — PROCALCITONIN: Procalcitonin: <0.1 ng/mL

## 2018-07-15 MED ORDER — FUROSEMIDE 10 MG/ML IJ SOLN
20.0000 mg | Freq: Every day | INTRAMUSCULAR | Status: DC
  Administered 2018-07-15 – 2018-07-17 (×3): 20 mg via INTRAVENOUS
  Filled 2018-07-15 (×3): qty 2

## 2018-07-15 NOTE — Progress Notes (Signed)
PROGRESS NOTE  Justin Perez HKV:[id number] DOB: January 19, 1976 DOA: 07/14/2018 PCP: London Pepper, MD  Brief history:   Per pt, he wasn't feeling so well around 0830 pm last night.  States acute onset sob that awoke him from his sleep.  He initially called 911, but then, after sitting upright, felt like he could take adequate breaths.  Denies fevers.  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   HPI/Recap of past 24 hours:  Patient is under rule out for covid 19, tele visit utilized to reduced exposure and conserve PPE He is feeling better, he is on room air, talking in full sentences , He report progressive intermittent left sided chest pain, currently chest pain, no fever, he reports intermittent hemoptysis He reports increase edema recently He does not check his blood pressure at home, he does not take any meds regularly   Assessment/Plan: Principal Problem:   Acute respiratory failure with hypoxia ([place]) Active Problems:   Chest pain in adult   Suspected Covid-19 Virus Infection   Essential hypertension   Elevated troponin   Morbid obesity with BMI of 50.0-59.9, adult (HCC)   OSA on CPAP   Hyperglycemia  hemoptysis, bilateral lung infiltrate, acute hypoxic respiratory failure Echo with preserved EF Troponin mild flat Now sure if cardiogenic vs primary lung , he is getting iv lasix, will monitor effect, he is started on asa and statin Cardiology and pulmonology consulted, will follow recommendations I have discussed with ID will keep patient as PUL for covid19, will repeat SARSCOV2 test on Saturday.  HTN urgency bp 181/121 on presentation with chest pain, troponin elevation He does not take may bp meds at home, he is on iv lasix currently bp is improving   Morbid obesity/OSA on cpap Body mass index is 56.73 kg/m.   Code Status: full  Family Communication: patient   Disposition Plan: not  ready to discharge   Consultants:  Cardiology  Pulmonology   Phone conversation with infectious disease Dr Johnnye Sima   Procedures:  none  Antibiotics:  none   Objective: BP 116/71 (BP Location: Left Arm)   Pulse 80   Temp 98.1 F (36.7 C) (Oral)   Resp (!) 25   Ht 5' 11"  (1.803 m)   Wt (!) 184.5 kg   SpO2 96%   BMI 56.73 kg/m   Intake/Output Summary (Last 24 hours) at 07/15/2018 1340 Last data filed at 07/15/2018 1041 Gross per 24 hour  Intake 480 ml  Output 1600 ml  Net -1120 ml   Filed Weights   07/14/18 1435 07/15/18 0540  Weight: (!) 172.9 kg (!) 184.5 kg    Exam:   General:  NAD, obese   Cardiovascular: sinus rhythm on tele  Respiratory: no increased respiratory effort, no hypoxia, no cough noted during tele vitis  Neuro: alert, oriented   Data Reviewed: Basic Metabolic Panel: Recent Labs  Lab 07/14/18 0355 07/15/18 0247  NA 140 141  K 4.2 3.6  CL 103 103  CO2 26 28  GLUCOSE 130* 106*  BUN 14 10  CREATININE 1.11 1.00  CALCIUM 9.1 8.8*  MG  --  1.8  PHOS  --  3.7   Liver Function Tests: Recent Labs  Lab 07/14/18 1029 07/15/18 0247  AST 58* 43*  ALT 74* 53*  ALKPHOS 83 69  BILITOT 1.1 1.4*  PROT 6.1* 6.1*  ALBUMIN 3.5 3.3*   No results for input(s): LIPASE, AMYLASE in  the last 168 hours. No results for input(s): AMMONIA in the last 168 hours. CBC: Recent Labs  Lab 07/14/18 0355 07/15/18 0247  WBC 9.2 7.6  NEUTROABS 5.6 4.9  HGB 17.5* 15.4  HCT 52.2* 45.4  MCV 93.2 92.3  PLT 223 169   Cardiac Enzymes:   Recent Labs  Lab 07/14/18 0635 07/14/18 1503 07/14/18 2109 07/15/18 0247  TROPONINI 0.22* 0.18* 0.22* 0.19*   BNP (last 3 results) Recent Labs    07/14/18 0355  BNP 255.2*    ProBNP (last 3 results) No results for input(s): PROBNP in the last 8760 hours.  CBG: Recent Labs  Lab 07/14/18 1223 07/14/18 1719 07/14/18 2215 07/15/18 0810 07/15/18 1153  GLUCAP 100* 126* 102* 112* 93    Recent Results  (from the past 240 hour(s))  SARS Coronavirus 2 (CEPHEID - Performed in [place] hospital lab), Hosp Order     Status: None   Collection Time: 07/14/18  4:45 AM  Result Value Ref Range Status   SARS Coronavirus 2 NEGATIVE NEGATIVE Final    Comment: (NOTE) If result is NEGATIVE SARS-CoV-2 target nucleic acids are NOT DETECTED. The SARS-CoV-2 RNA is generally detectable in upper and lower  respiratory specimens during the acute phase of infection. The lowest  concentration of SARS-CoV-2 viral copies this assay can detect is 250  copies / mL. A negative result does not preclude SARS-CoV-2 infection  and should not be used as the sole basis for treatment or other  patient management decisions.  A negative result may occur with  improper specimen collection / handling, submission of specimen other  than nasopharyngeal swab, presence of viral mutation(s) within the  areas targeted by this assay, and inadequate number of viral copies  (<250 copies / mL). A negative result must be combined with clinical  observations, patient history, and epidemiological information. If result is POSITIVE SARS-CoV-2 target nucleic acids are DETECTED. The SARS-CoV-2 RNA is generally detectable in upper and lower  respiratory specimens dur ing the acute phase of infection.  Positive  results are indicative of active infection with SARS-CoV-2.  Clinical  correlation with patient history and other diagnostic information is  necessary to determine patient infection status.  Positive results do  not rule out bacterial infection or co-infection with other viruses. If result is PRESUMPTIVE POSTIVE SARS-CoV-2 nucleic acids MAY BE PRESENT.   A presumptive positive result was obtained on the submitted specimen  and confirmed on repeat testing.  While 2019 novel coronavirus  (SARS-CoV-2) nucleic acids may be present in the submitted sample  additional confirmatory testing may be necessary for epidemiological  and /  or clinical management purposes  to differentiate between  SARS-CoV-2 and other Sarbecovirus currently known to infect humans.  If clinically indicated additional testing with an alternate test  methodology [phone]) is advised. The SARS-CoV-2 RNA is generally  detectable in upper and lower respiratory sp ecimens during the acute  phase of infection. The expected result is Negative. Fact Sheet for Patients:  StrictlyIdeas.no Fact Sheet for Healthcare Providers: BankingDealers.co.za This test is not yet approved or cleared by the [place] FDA and has been authorized for detection and/or diagnosis of SARS-CoV-2 by FDA under an Emergency Use Authorization (EUA).  This EUA will remain in effect (meaning this test can be used) for the duration of the COVID-19 declaration under Section 564(b)(1) of the Act, 21 U.S.C. section 360bbb-3(b)(1), unless the authorization is terminated or revoked sooner. Performed at [facility], [place]  [address] [place], [place] [address]   SARS Coronavirus 2 [facility] order, Performed in [facility] hospital lab)     Status: None   Collection Time: 07/14/18  6:20 PM  Result Value Ref Range Status   SARS Coronavirus 2 NEGATIVE NEGATIVE Final    Comment: (NOTE) If result is NEGATIVE SARS-CoV-2 target nucleic acids are NOT DETECTED. The SARS-CoV-2 RNA is generally detectable in upper and lower  respiratory specimens during the acute phase of infection. The lowest  concentration of SARS-CoV-2 viral copies this assay can detect is 250  copies / mL. A negative result does not preclude SARS-CoV-2 infection  and should not be used as the sole basis for treatment or other  patient management decisions.  A negative result may occur with  improper specimen collection / handling, submission of specimen other  than nasopharyngeal swab, presence of viral mutation(s) within the  areas targeted by this assay, and  inadequate number of viral copies  (<250 copies / mL). A negative result must be combined with clinical  observations, patient history, and epidemiological information. If result is POSITIVE SARS-CoV-2 target nucleic acids are DETECTED. The SARS-CoV-2 RNA is generally detectable in upper and lower  respiratory specimens dur ing the acute phase of infection.  Positive  results are indicative of active infection with SARS-CoV-2.  Clinical  correlation with patient history and other diagnostic information is  necessary to determine patient infection status.  Positive results do  not rule out bacterial infection or co-infection with other viruses. If result is PRESUMPTIVE POSTIVE SARS-CoV-2 nucleic acids MAY BE PRESENT.   A presumptive positive result was obtained on the submitted specimen  and confirmed on repeat testing.  While 2019 novel coronavirus  (SARS-CoV-2) nucleic acids may be present in the submitted sample  additional confirmatory testing may be necessary for epidemiological  and / or clinical management purposes  to differentiate between  SARS-CoV-2 and other Sarbecovirus currently known to infect humans.  If clinically indicated additional testing with an alternate test  methodology [phone]) is advised. The SARS-CoV-2 RNA is generally  detectable in upper and lower respiratory sp ecimens during the acute  phase of infection. The expected result is Negative. Fact Sheet for Patients:  StrictlyIdeas.no Fact Sheet for Healthcare Providers: BankingDealers.co.za This test is not yet approved or cleared by the [place] FDA and has been authorized for detection and/or diagnosis of SARS-CoV-2 by FDA under an Emergency Use Authorization (EUA).  This EUA will remain in effect (meaning this test can be used) for the duration of the COVID-19 declaration under Section 564(b)(1) of the Act, 21 U.S.C. section 360bbb-3(b)(1), unless the  authorization is terminated or revoked sooner. Performed at [facility], [place] [address], [place], [place] [address]   MRSA PCR Screening     Status: None   Collection Time: 07/15/18  5:26 AM  Result Value Ref Range Status   MRSA by PCR NEGATIVE NEGATIVE Final    Comment:        The GeneXpert MRSA Assay (FDA approved for NASAL specimens only), is one component of a comprehensive MRSA colonization surveillance program. It is not intended to diagnose MRSA infection nor to guide or monitor treatment for MRSA infections. Performed at [facility], [place] [address] [place], [place] [address]      Studies: No results found.  Scheduled Meds: . aspirin EC  81 mg Oral Daily  . atorvastatin  40 mg Oral q1800  . enoxaparin (LOVENOX) injection  40 mg  Subcutaneous Q24H  . insulin aspart  0-15 Units Subcutaneous TID WC  . sodium chloride flush  3 mL Intravenous Q12H    Continuous Infusions: . sodium chloride       Time spent: 55mns, case discussed with pulmonology and cardiology I have personally reviewed and interpreted on  07/15/2018 daily labs, tele strips, imagings as discussed above under date review session and assessment and plans.  I reviewed all nursing notes, pharmacy notes, consultant notes,  vitals, pertinent old records  I have discussed plan of care as described above with RN , patient  on 07/15/2018   FFlorencia ReasonsMD, PhD  [facility] Pager [phone] If 7PM-7AM, please contact night-coverage at [email or web address], password [facility]5/28/2020, 1:40 PM  LOS: 1 day

## 2018-07-15 NOTE — Consult Note (Signed)
Cardiology Consultation:    Due to Honor pandemic this visit was performed virtually to reduce risk to patient and provider.  Full physical examination not performed.  Patient ID: ZAKHARI FOGEL MRN: [id number]; DOB: Sep 20, 1975  Admit date: 07/14/2018 Date of Consult: 07/15/2018  Primary Care Provider: London Pepper, MD Primary Cardiologist: New   Patient Profile:   JACEN CARLINI is a 43 y.o. male with a hx of HTN, OSA, morbid obesity who is being seen today for the evaluation of CP and elevated troponin at the request of Karmen Bongo MD.  History of Present Illness:   Previously seen by Dr Einar Gip. Cardiac cath 8/11 showed normal LV function; normal coronaries.  Patient typically does not have dyspnea on exertion, orthopnea, PND, pedal edema or syncope.  He states he has had occasional chest pain since his cardiac catheterization in 2011.  It is in the left breast area and described as a stabbing pain.  It radiates to his back.  It increases with lying down.  It occasionally occurs with exertion as well.  Lasts 10 to 30 minutes and resolves.  2 days ago he noticed chest tightness while working.  That evening at home his chest tightness persisted and he developed shortness of breath.  He also had hemoptysis stating he coughed up approximately 1/3 cup blood.  He presented to the emergency room and has been treated with albuterol and bronchodilators with improvement.  Note there was a coworker who tested positive for COVID but patient states he was not physically close to this person.  He continues to have chest tightness at present that increases with cough and lying flat.  Past Medical History:  Diagnosis Date  . Hypertension   . Morbid obesity with BMI of 50.0-59.9, adult ([place])   . OSA on CPAP     Past Surgical History:  Procedure Laterality Date  . TONSILLECTOMY    . URETHRA SURGERY       Inpatient Medications: Scheduled Meds: . aspirin EC  81 mg Oral Daily  . atorvastatin   40 mg Oral q1800  . enoxaparin (LOVENOX) injection  40 mg Subcutaneous Q24H  . insulin aspart  0-15 Units Subcutaneous TID WC  . sodium chloride flush  3 mL Intravenous Q12H   Continuous Infusions: . sodium chloride     PRN Meds: sodium chloride, acetaminophen, albuterol, nitroGLYCERIN, ondansetron (ZOFRAN) IV, sodium chloride flush  Allergies:   No Known Allergies  Social History:   Social History   Socioeconomic History  . Marital status: Married    Spouse name: Not on file  . Number of children: Not on file  . Years of education: Not on file  . Highest education level: Not on file  Occupational History  . Not on file  Social Needs  . Financial resource strain: Not on file  . Food insecurity:    Worry: Not on file    Inability: Not on file  . Transportation needs:    Medical: Not on file    Non-medical: Not on file  Tobacco Use  . Smoking status: Never Smoker  . Smokeless tobacco: Never Used  Substance and Sexual Activity  . Alcohol use: Yes    Comment: occ  . Drug use: Never  . Sexual activity: Not on file  Lifestyle  . Physical activity:    Days per week: Not on file    Minutes per session: Not on file  . Stress: Not on file  Relationships  . Social connections:  Talks on phone: Not on file    Gets together: Not on file    Attends religious service: Not on file    Active member of club or organization: Not on file    Attends meetings of clubs or organizations: Not on file    Relationship status: Not on file  . Intimate partner violence:    Fear of current or ex partner: Not on file    Emotionally abused: Not on file    Physically abused: Not on file    Forced sexual activity: Not on file  Other Topics Concern  . Not on file  Social History Narrative  . Not on file    Family History:    Family History  Problem Relation Age of Onset  . Heart failure Mother 71  . Diabetes Mother   . COPD Father      ROS:  Please see the history of present  illness.  Patient denies fevers, chills.  He has hemoptysis.  No melena or hematochezia. All other ROS reviewed and negative.     Physical Exam/Data:   Vitals:   07/15/18 0742 07/15/18 0812 07/15/18 0827 07/15/18 1156  BP:  (!) 124/55  116/71  Pulse: 75 79  80  Resp:    (!) 25  Temp:  97.8 F (36.6 C)  98.1 F (36.7 C)  TempSrc:  Oral  Oral  SpO2: 90% 94% 95% 96%  Weight:      Height:        Intake/Output Summary (Last 24 hours) at 07/15/2018 1228 Last data filed at 07/15/2018 1041 Gross per 24 hour  Intake 480 ml  Output 1600 ml  Net -1120 ml   Last 3 Weights 07/15/2018 07/14/2018 05/28/2017  Weight (lbs) 406 lb 12 oz 381 lb 2.8 oz 407 lb  Weight (kg) 184.5 kg 172.9 kg 184.614 kg     Body mass index is 56.73 kg/m.  General:  Well nourished, well developed, in no acute distress Patient answers questions appropriately. Psych:  Normal affect  Remainder of physical examination not performed (tele-visit; coronavirus pandemic)  EKG:  The EKG was personally reviewed and demonstrates: Normal sinus rhythm with no ST changes; LAE. Follow-up electrocardiogram showed sinus rhythm with lateral T wave inversion. Telemetry:  Telemetry was personally reviewed and demonstrates:  Sinus   Laboratory Data:  Chemistry Recent Labs  Lab 07/14/18 0355 07/15/18 0247  NA 140 141  K 4.2 3.6  CL 103 103  CO2 26 28  GLUCOSE 130* 106*  BUN 14 10  CREATININE 1.11 1.00  CALCIUM 9.1 8.8*  GFRNONAA >60 >60  GFRAA >60 >60  ANIONGAP 11 10    Recent Labs  Lab 07/14/18 1029 07/15/18 0247  PROT 6.1* 6.1*  ALBUMIN 3.5 3.3*  AST 58* 43*  ALT 74* 53*  ALKPHOS 83 69  BILITOT 1.1 1.4*   Hematology Recent Labs  Lab 07/14/18 0355 07/15/18 0247  WBC 9.2 7.6  RBC 5.60 4.92  HGB 17.5* 15.4  HCT 52.2* 45.4  MCV 93.2 92.3  MCH 31.3 31.3  MCHC 33.5 33.9  RDW 13.6 13.6  PLT 223 169   Cardiac Enzymes Recent Labs  Lab 07/14/18 0635 07/14/18 1503 07/14/18 2109 07/15/18 0247   TROPONINI 0.22* 0.18* 0.22* 0.19*   BNP Recent Labs  Lab 07/14/18 0355  BNP 255.2*    DDimer  Recent Labs  Lab 07/14/18 1029  DDIMER 0.88*    Radiology/Studies:  Ct Angio Chest Pe W And/or Wo Contrast  Result  Date: 07/14/2018 CLINICAL DATA:  Chest tightness.  PE suspected. EXAM: CT ANGIOGRAPHY CHEST WITH CONTRAST TECHNIQUE: Multidetector CT imaging of the chest was performed using the standard protocol during bolus administration of intravenous contrast. Multiplanar CT image reconstructions and MIPs were obtained to evaluate the vascular anatomy. CONTRAST:  119m OMNIPAQUE IOHEXOL 350 MG/ML SOLN COMPARISON:  Radiography from earlier today FINDINGS: Cardiovascular: Borderline heart size. No pericardial effusion. Negative for pulmonary embolism. No atherosclerotic calcification. Mediastinum/Nodes: Negative for adenopathy or mass. Lungs/Pleura: Bilateral airspace opacity with perihilar predilection. There is a degree of septal thickening best seen at the bases. Trace, if any, pleural fluid. Upper Abdomen: Negative Musculoskeletal: Spondylosis and disc narrowing. Review of the MIP images confirms the above findings. IMPRESSION: 1. Bilateral airspace disease and septal thickening which could be multifocal pneumonia or edema. 2. Negative for pulmonary embolism. Electronically Signed   By: JMonte FantasiaM.D.   On: 07/14/2018 07:38   Dg Chest Port 1 View  Result Date: 07/14/2018 CLINICAL DATA:  Shortness of breath and cough today EXAM: PORTABLE CHEST 1 VIEW COMPARISON:  05/22/2007 FINDINGS: Cardiac shadow is mildly prominent but accentuated by the frontal technique. Some atelectatic changes are noted in the right base which improved somewhat on the second image. No other focal abnormality is noted. IMPRESSION: Mild right basilar atelectasis. Electronically Signed   By: MInez CatalinaM.D.   On: 07/14/2018 03:57    Assessment and Plan:   1. Dyspnea-etiology unclear.  Presentation initially  concerning for COVID.  Patient had abrupt onset of dyspnea with significant hemoptysis.  Potential exposure at work.  Initial chest CT with pulmonary infiltrates.  However, testing negative x2.  I remain concerned that COVID is a possibility and agree with retesting timing based on infectious disease recommendations.  Pulmonary edema also possible but would be unusual to have the degree of hemoptysis patient is describing with pulmonary edema.  Echocardiogram shows normal LV function.  BNP minimally elevated.  Symptoms also improved with albuterol.  Will give Lasix 20 mg daily and follow. 2. Chest tightness-patient's chest tightness is longstanding and atypical for ischemia.  He has had these symptoms since his catheterization in 2011 which revealed normal coronary arteries.  They increase with lying flat.  However his troponin is minimally elevated and his follow-up electrocardiogram showed worsening lateral T wave inversion.  Once it is clear he does not have COVID we could pursue stress nuclear study. Continue ASA and statin. 3. Hypertension-patient's blood pressure has improved since admission.  We will follow and add additional medications as needed. 4. Morbid obesity-needs weight loss. 5. Elevated liver functions-question related to NASH.  For questions or updates, please contact [email or web address] consult [email or web address] for contact info under     Signed, BKirk Ruths MD  07/15/2018 12:28 PM

## 2018-07-15 NOTE — Consult Note (Signed)
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   History of present illness   43 year old male who was admitted 5/27 w/ cc: dyspnea and left sided chest pain. Initially had chest discomfort about 1 week prior which resolved spontaneously. These symptoms returned on 5/26 w/ initial chest tightness, then had associated cough w/ hemoptysis (the chest pain woke him up. Was associated w/ worsening resting dyspnea and orthopnea and then when he tried to ) and worsening shortness of breath. He did not know of any direct contact w/ COVID-19 exposure but works as a Chief Strategy Officer and apparently there was a Set designer who tested positive and concern was raised for possible secondary exposure. In ER initial room air sats 77%; initial COVID negative, BNP 255, placed on NRB mask.  Dx eval:  CT chest obtained: showing bilateral patchy airspace disease. Was admitted w/ working dx of acute respiratory failure in setting of bilateral infiltrates.  -COVID-19: tested a second time negative.  -ECHO: nml RV fxn, EF 55-60% left atrial size dilated RA dilated.  rx to date:  -Oxygen  -cards consult->lasix X 1 -plan for 3rd covid test -PCCM consulted 5/28 to assist w/ evaluation of his pulm infiltrates  Notes: Works as Chief Strategy Officer. Has had environmental exposures w/ possible dusts and molds w/ bathroom/construction modifications also recently did work on his [facility] system. Marland Kitchen No pets, no sig travel. No sick exposure.  Past Medical History  OSA, HTN, morbid obesity   Significant Hospital Events     Consults:  PULM and CARDS 5/28  Procedures:    Significant Diagnostic Tests:  CT chest 5/27: 1. Bilateral airspace disease and septal  thickening which could bemultifocal pneumonia or edema.2. Negative for pulmonary embolism. ECHO 5/27: Left Ventricle: The left ventricle has normal systolic function, with an ejection fraction of 55-60%. The cavity size was normal. There is moderately increased left ventricular wall thickness. Left ventricular diastolic parameters we re normal.  Indeterminate filling pressures Right Ventricle: The right ventricle has normal systolic function. The cavity was normal. There is no increase in right ventricular wall thickness. Left Atrium: Left atrial size was moderately dilated. Right Atrium: Right atrial size was mildly dilated. Right atrial pressure is estimated at 3 mmHg. Interatrial Septum: No atrial level shunt detected by color flow Doppler. BNP 5/27: 244 CRP 1.4, ddimer 0.88, ferritin : 120, fibrinogen 378, LDH 259 UDS 5/28: negative   Micro Data:  Covid -19  5/27X2: both negative   Antimicrobials:   Interim history/subjective:  Feels better   Objective   Blood pressure 116/71, pulse 80, temperature 98.1 F (36.7 C), temperature source Oral, resp. rate (Abnormal) 25, height 5' 11"  (1.803 m), weight (Abnormal) 184.5 kg, SpO2 96 %.        Intake/Output Summary (Last 24 hours) at 07/15/2018 1346 Last data filed at 07/15/2018 1041 Gross per 24 hour  Intake 480 ml  Output 1600 ml  Net -1120 ml   Filed Weights   07/14/18 1435 07/15/18 0540  Weight: (Abnormal) 172.9 kg (Abnormal) 184.5 kg    Examination: General: obese 43 year old wm sitting in bed. No distress HENT: NCAT no JVD MMM Lungs: clear and w/out accessory use. Speaking full sentences  Cardiovascular: RRR  no MRG Abdomen: soft not tender  Extremities: LE edema brisk CR  Neuro: alert and oriented  GU: voids   Resolved Hospital Problem list     Assessment & Plan:   Acute hypoxic respiratory failure in setting of bilateral pulmonary infiltrates of unclear etiology.  Ddx: infectious: either CAP or viral (agree  COVID-19  A possibility but seems unlikely. 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) so wonder about occupational exposure, also consider connective tissue disease origin or lastly cardiogenic which I think more likely as most recent episode awoke him suddenly and was associated w/ CP, PALPITATIONS, and hemoptysis w/ the shortness of breath Plan/rec Cont supplemental oxygen  Repeat viral  inflammatory markers Sending auto-immune workup HSP  Ck HIV CK PCT After COVID back would focus on BP control, also consider stress test, and would keep close eye on telemetry->seems like this could also be contributing.  Will also repeat CXR in am. If now clear would make pulmonary edema more likely still Lastly if all of above neg and infiltrates persist we should consider FOB at that point.   OSA Plan  Need to resume CPAP once ok to do so  HTN Plan Suspect a contributing cause Deferring to cards.   Best practice:  Diet: NAS/heart healthy  Pain/Anxiety/Delirium protocol (if indicated): NA VAP protocol (if indicated): NA DVT prophylaxis: NA GI prophylaxis: ppi Glucose control: na Mobility: OOB Code Status: full code  Family Communication: pending  Disposition:   Labs   CBC: Recent Labs  Lab 07/14/18 0355 07/15/18 0247  WBC 9.2 7.6  NEUTROABS 5.6 4.9  HGB 17.5* 15.4  HCT 52.2* 45.4  MCV 93.2 92.3  PLT 223 338    Basic Metabolic Panel: Recent Labs  Lab 07/14/18 0355 07/15/18 0247  NA 140 141  K 4.2 3.6  CL 103 103  CO2 26 28  GLUCOSE 130* 106*  BUN 14 10  CREATININE 1.11 1.00  CALCIUM 9.1 8.8*  MG  --  1.8  PHOS  --  3.7   GFR: Estimated Creatinine Clearance: 162 mL/min (by C-G formula based on SCr of 1 mg/dL). Recent Labs  Lab 07/14/18 0355 07/14/18 1029 07/15/18 0247  PROCALCITON  --  <0.10  --   WBC 9.2  --  7.6    Liver Function Tests: Recent Labs  Lab 07/14/18 1029 07/15/18 0247  AST 58* 43*   ALT 74* 53*  ALKPHOS 83 69  BILITOT 1.1 1.4*  PROT 6.1* 6.1*  ALBUMIN 3.5 3.3*   No results for input(s): LIPASE, AMYLASE in the last 168 hours. No results for input(s): AMMONIA in the last 168 hours.  ABG No results found for: PHART, PCO2ART, PO2ART, HCO3, TCO2, ACIDBASEDEF, O2SAT   Coagulation Profile: No results for input(s): INR, PROTIME in the last 168 hours.  Cardiac Enzymes: Recent Labs  Lab 07/14/18 0635 07/14/18 1503 07/14/18 2109 07/15/18 0247  TROPONINI 0.22* 0.18* 0.22* 0.19*    HbA1C: Hgb A1c MFr Bld  Date/Time Value Ref Range Status  07/14/2018 10:29 AM 5.5 4.8 - 5.6 % Final    Comment:    (NOTE) Pre diabetes:          5.7%-6.4% Diabetes:              >6.4% Glycemic control for   <7.0% adults with diabetes     CBG: Recent Labs  Lab 07/14/18 1223 07/14/18 1719 07/14/18 2215 07/15/18 0810 07/15/18 1153  GLUCAP 100* 126* 102* 112* 93  Review of Systems:   Review of Systems  Constitutional: Positive for diaphoresis and malaise/fatigue. Negative for chills and fever.  HENT: Positive for hearing loss. Negative for congestion, nosebleeds and sinus pain.   Eyes: Positive for blurred vision.  Respiratory: Positive for cough, hemoptysis, shortness of breath and wheezing.   Cardiovascular: Positive for chest pain, palpitations, orthopnea, leg swelling and PND.  Gastrointestinal: Positive for abdominal pain and heartburn.  Genitourinary: Negative.   Musculoskeletal: Negative.   Skin: Negative.   Neurological: Positive for dizziness.  Endo/Heme/Allergies: Negative.   Psychiatric/Behavioral: Negative.      Past Medical History  He,  has a past medical history of Hypertension, Morbid obesity with BMI of 50.0-59.9, adult ([place]), and OSA on CPAP.   Surgical History    Past Surgical History:  Procedure Laterality Date  . biceps surgery    . TONSILLECTOMY    . URETHRA SURGERY       Social History   reports that he has never smoked. He has  never used smokeless tobacco. He reports current alcohol use. He reports that he does not use drugs.   Family History   His family history includes COPD in his father; Diabetes in his mother; Heart failure (age of onset: 66) in his mother.   Allergies No Known Allergies   Home Medications  Prior to Admission medications   Not on File     Critical care time: NA    Erick Colace ACNP-BC [place] Pager # [phone] OR # [phone] if no answer

## 2018-07-16 ENCOUNTER — Inpatient Hospital Stay (HOSPITAL_COMMUNITY): Payer: 59

## 2018-07-16 DIAGNOSIS — R7989 Other specified abnormal findings of blood chemistry: Secondary | ICD-10-CM

## 2018-07-16 LAB — COMPREHENSIVE METABOLIC PANEL
ALT: 42 U/L (ref 0–44)
AST: 34 U/L (ref 15–41)
Albumin: 3.3 g/dL — ABNORMAL LOW (ref 3.5–5.0)
Alkaline Phosphatase: 67 U/L (ref 38–126)
Anion gap: 12 (ref 5–15)
BUN: 10 mg/dL (ref 6–20)
CO2: 28 mmol/L (ref 22–32)
Calcium: 9.1 mg/dL (ref 8.9–10.3)
Chloride: 100 mmol/L (ref 98–111)
Creatinine, Ser: 0.94 mg/dL (ref 0.61–1.24)
GFR calc Af Amer: >60 mL/min (ref >60)
GFR calc non Af Amer: >60 mL/min (ref >60)
Glucose, Bld: 100 mg/dL — ABNORMAL HIGH (ref 70–99)
Potassium: 3.6 mmol/L (ref 3.5–5.1)
Sodium: 140 mmol/L (ref 135–145)
Total Bilirubin: 1.2 mg/dL (ref 0.3–1.2)
Total Protein: 6.2 g/dL — ABNORMAL LOW (ref 6.5–8.1)

## 2018-07-16 LAB — CBC WITH DIFFERENTIAL/PLATELET
Abs Immature Granulocytes: 0.02 10*3/uL (ref 0.00–0.07)
Basophils Absolute: 0 10*3/uL (ref 0.0–0.1)
Basophils Relative: 1 %
Eosinophils Absolute: 0.2 10*3/uL (ref 0.0–0.5)
Eosinophils Relative: 3 %
HCT: 47.8 % (ref 39.0–52.0)
Hemoglobin: 16.1 g/dL (ref 13.0–17.0)
Immature Granulocytes: 0 %
Lymphocytes Relative: 25 %
Lymphs Abs: 1.9 10*3/uL (ref 0.7–4.0)
MCH: 31 pg (ref 26.0–34.0)
MCHC: 33.7 g/dL (ref 30.0–36.0)
MCV: 91.9 fL (ref 80.0–100.0)
Monocytes Absolute: 0.6 10*3/uL (ref 0.1–1.0)
Monocytes Relative: 8 %
Neutro Abs: 4.7 10*3/uL (ref 1.7–7.7)
Neutrophils Relative %: 63 %
Platelets: 172 10*3/uL (ref 150–400)
RBC: 5.2 MIL/uL (ref 4.22–5.81)
RDW: 13.3 % (ref 11.5–15.5)
WBC: 7.6 10*3/uL (ref 4.0–10.5)
nRBC: 0 % (ref 0.0–0.2)

## 2018-07-16 LAB — GLUCOSE, CAPILLARY
Glucose-Capillary: 106 mg/dL — ABNORMAL HIGH (ref 70–99)
Glucose-Capillary: 84 mg/dL (ref 70–99)
Glucose-Capillary: 86 mg/dL (ref 70–99)
Glucose-Capillary: 117 mg/dL — ABNORMAL HIGH (ref 70–99)

## 2018-07-16 LAB — ANCA TITERS
Atypical P-ANCA titer: <1:20 {titer}
C-ANCA: <1:20 {titer}
P-ANCA: <1:20 {titer}

## 2018-07-16 LAB — PROCALCITONIN: Procalcitonin: <0.1 ng/mL

## 2018-07-16 LAB — SJOGRENS SYNDROME-B EXTRACTABLE NUCLEAR ANTIBODY: SSB (La) (ENA) Antibody, IgG: <0.2 AI (ref 0.0–0.9)

## 2018-07-16 LAB — SJOGRENS SYNDROME-A EXTRACTABLE NUCLEAR ANTIBODY: SSA (Ro) (ENA) Antibody, IgG: <0.2 AI (ref 0.0–0.9)

## 2018-07-16 LAB — MAGNESIUM: Magnesium: 1.9 mg/dL (ref 1.7–2.4)

## 2018-07-16 LAB — RHEUMATOID FACTOR: Rheumatoid fact SerPl-aCnc: <10 IU/mL (ref 0.0–13.9)

## 2018-07-16 LAB — HIV ANTIBODY (ROUTINE TESTING W REFLEX): HIV Screen 4th Generation wRfx: NONREACTIVE

## 2018-07-16 LAB — ANTI-SCLERODERMA ANTIBODY: Scleroderma (Scl-70) (ENA) Antibody, IgG: <0.2 AI (ref 0.0–0.9)

## 2018-07-16 LAB — PHOSPHORUS: Phosphorus: 2.9 mg/dL (ref 2.5–4.6)

## 2018-07-16 MED ORDER — AMLODIPINE BESYLATE 5 MG PO TABS
5.0000 mg | ORAL_TABLET | Freq: Every day | ORAL | Status: DC
  Administered 2018-07-16: 5 mg via ORAL
  Filled 2018-07-16: qty 1

## 2018-07-16 NOTE — Progress Notes (Addendum)
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 Found to have acute hypoxic respiratory failure d/t bilateral pulmonary infiltrates of unclear etiology.  Possible work exposure to [employer] but testing negative x2.  ECHO with LVEF 55-60%, left atrial dilated, RA dilated.   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 Marland Kitchen No pets, no sig travel. No sick exposure.   Past Medical History  OSA, HTN, morbid obesity   Significant Hospital Events   5/27 Admit  5/28 PCCM consulted   Consults:  PULM and CARDS 5/28  Procedures:    Significant Diagnostic Tests:  CT chest 5/27 >>  Bilateral airspace disease and septal thickening which could bemultifocal pneumonia or edema. Negative for pulmonary embolism. ECHO 5/27 >> Left Ventricle: The left ventricle has normal systolic function, with an ejection fraction of 55-60%. The cavity size was normal. There is moderately increased left ventricular wall thickness. Left ventricular diastolic parameters we re normal.  Indeterminate filling pressures.  Right Ventricle: The right ventricle has normal systolic function. The cavity was normal. There is no increase in right ventricular wall thickness. Left Atrium: Left atrial size was moderately dilated. Right Atrium: Right atrial size was mildly dilated. Right atrial pressure is estimated at 3 mmHg. Interatrial Septum: No atrial level shunt detected by color flow Doppler. BNP 5/27 >> 244 CRP 1.4, ddimer 0.88, ferritin : 120, fibrinogen 378, LDH 259 UDS 5/28 >> negative   Micro Data:  Covid -19  5/27 X2 >> both negative    Antimicrobials:    Interim history/subjective:  Pt reports feeling much better.  States 80% better overall.  Less cough and minimal sputum production with pink tinge.  Pt reports he has not been taking his blood pressure meds as prescribed.   Objective   Blood pressure (!) 159/105, pulse 85, temperature 98.2 F (36.8 C), temperature source Oral, resp. rate 19, height 5' 11"  (1.803 m), weight (!) 184.5 kg, SpO2 100 %.        Intake/Output Summary (Last 24 hours) at 07/16/2018 1056 Last data filed at 07/16/2018 2458 Gross per 24 hour  Intake 3 ml  Output 2300 ml  Net -2297 ml   Filed Weights   07/14/18 1435 07/15/18 0540  Weight: (!) 172.9 kg (!) 184.5 kg    Examination: General: obese adult male lying in bed in NAD HEENT: MM pink/moist, good dentition  Neuro: AAOx4, MAE, non-focal  CV: s1s2 rrr, no m/r/g, few PVC's noted on monitor PULM: even/non-labored, normal work of breathing, clear  KD:XIPJ, non-tender, bsx4 active  Extremities: warm/dry, trace LE edema  Skin: no rashes or lesions  Resolved Hospital Problem list     Assessment & Plan:   Acute Hypoxic Respiratory Failure in setting of Bilateral Pulmonary Infiltrates  -suspect hypertensive urgency as driving cause with pulmonary edema  Ddx: infectious: either CAP or viral (agree COVID-19 a possibility but seems unlikely given negative x2. 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) so wonder about occupational exposure, also consider connective tissue disease origin or lastly cardiogenic which is more likely as most recent episode awoke him suddenly and was associated  w/ CP, PALPITATIONS, and hemoptysis w/ the shortness of breath and he does not take his BP medications  P: Pulmonary hygiene - IS, mobilize  Follow up auto-immune markers, hypersensitivity panel  Defer third COVID to primary > doubt this is source given neg x2 and other cause for findings Follow  intermittent CXR > significantly improved, 3.4L negative  If symptoms not resolved with good BP control and after markers, would consider FOB.   OSA P: Resume CPAP once final COVID testing back   HTN P: Appreciate Cardiology input  Continue aggressive BP control  Low sodium, heart healthy diet   Best practice:  Diet: NAS/heart healthy  Pain/Anxiety/Delirium protocol (if indicated): NA VAP protocol (if indicated): NA DVT prophylaxis: NA GI prophylaxis: ppi Glucose control: na Mobility: OOB Code Status: full code  Family Communication: Wife updated via phone during exam by Dr. Lamonte Sakai Disposition: Floor  Labs   CBC: Recent Labs  Lab 07/14/18 0355 07/15/18 0247 07/16/18 0618  WBC 9.2 7.6 7.6  NEUTROABS 5.6 4.9 4.7  HGB 17.5* 15.4 16.1  HCT 52.2* 45.4 47.8  MCV 93.2 92.3 91.9  PLT 223 169 694    Basic Metabolic Panel: Recent Labs  Lab 07/14/18 0355 07/15/18 0247 07/16/18 0618  NA 140 141 140  K 4.2 3.6 3.6  CL 103 103 100  CO2 26 28 28   GLUCOSE 130* 106* 100*  BUN 14 10 10   CREATININE 1.11 1.00 0.94  CALCIUM 9.1 8.8* 9.1  MG  --  1.8 1.9  PHOS  --  3.7 2.9   GFR: Estimated Creatinine Clearance: 172.3 mL/min (by C-G formula based on SCr of 0.94 mg/dL). Recent Labs  Lab 07/14/18 0355 07/14/18 1029 07/15/18 0247 07/15/18 1638 07/16/18 0618  PROCALCITON  --  <0.10  --  <0.10 <0.10  WBC 9.2  --  7.6  --  7.6    Liver Function Tests: Recent Labs  Lab 07/14/18 1029 07/15/18 0247 07/16/18 0618  AST 58* 43* 34  ALT 74* 53* 42  ALKPHOS 83 69 67  BILITOT 1.1 1.4* 1.2  PROT 6.1* 6.1* 6.2*  ALBUMIN 3.5 3.3* 3.3*   No results for input(s): LIPASE, AMYLASE in the last 168 hours. No results for input(s): AMMONIA in the last 168 hours.  ABG No results found for: PHART, PCO2ART, PO2ART, HCO3, TCO2, ACIDBASEDEF, O2SAT   Coagulation Profile: No results for input(s): INR, PROTIME in the last 168 hours.  Cardiac Enzymes: Recent Labs  Lab 07/14/18  0635 07/14/18 1503 07/14/18 2109 07/15/18 0247  TROPONINI 0.22* 0.18* 0.22* 0.19*    HbA1C: Hgb A1c MFr Bld  Date/Time Value Ref Range Status  07/14/2018 10:29 AM 5.5 4.8 - 5.6 % Final    Comment:    (NOTE) Pre diabetes:          5.7%-6.4% Diabetes:              >6.4% Glycemic control for   <7.0% adults with diabetes     CBG: Recent Labs  Lab 07/15/18 0810 07/15/18 1153 07/15/18 1625 07/15/18 2116 07/16/18 0817  GLUCAP 112* 93 92 105* 106*     Critical care time: NA     Noe Gens, NP-C [facility] Pgr: [phone] or if no answer [phone] 07/16/2018, 10:56 AM

## 2018-07-16 NOTE — Progress Notes (Signed)
Progress Note  Patient Name: Justin Perez Date of Encounter: 07/16/2018  Primary Cardiologist: New Dr Stanford Breed  Telehealth visit performed without direct patient contact due to coronavirus pandemic and risk of transmission.   Subjective   Dyspnea has improved; mild chest tightness; patient still has some hemoptysis but improved.  Inpatient Medications    Scheduled Meds: . aspirin EC  81 mg Oral Daily  . atorvastatin  40 mg Oral q1800  . enoxaparin (LOVENOX) injection  40 mg Subcutaneous Q24H  . furosemide  20 mg Intravenous Daily  . insulin aspart  0-15 Units Subcutaneous TID WC  . sodium chloride flush  3 mL Intravenous Q12H   Continuous Infusions: . sodium chloride 250 mL (07/15/18 2137)   PRN Meds: sodium chloride, acetaminophen, albuterol, nitroGLYCERIN, ondansetron (ZOFRAN) IV, sodium chloride flush   Vital Signs    Vitals:   07/16/18 0500 07/16/18 0600 07/16/18 0700 07/16/18 0820  BP: (!) 140/94   (!) 159/105  Pulse: 71 79 72 85  Resp: (!) 24 (!) 30 19 19   Temp:    98.2 F (36.8 C)  TempSrc:    Oral  SpO2: 91% (!) 87% 94% 100%  Weight:      Height:        Intake/Output Summary (Last 24 hours) at 07/16/2018 1008 Last data filed at 07/16/2018 0852 Gross per 24 hour  Intake 243 ml  Output 2300 ml  Net -2057 ml   Last 3 Weights 07/15/2018 07/14/2018 05/28/2017  Weight (lbs) 406 lb 12 oz 381 lb 2.8 oz 407 lb  Weight (kg) 184.5 kg 172.9 kg 184.614 kg      Telemetry    Sinus with occasional PVC- Personally Reviewed   Physical Exam   GEN: No acute distress.   Answers questions appropriately Psych: Normal affect  Remainder physical examination not performed (coronavirus pandemic; virtual visit)  Labs    Chemistry Recent Labs  Lab 07/14/18 0355 07/14/18 1029 07/15/18 0247 07/16/18 0618  NA 140  --  141 140  K 4.2  --  3.6 3.6  CL 103  --  103 100  CO2 26  --  28 28  GLUCOSE 130*  --  106* 100*  BUN 14  --  10 10  CREATININE 1.11  --   1.00 0.94  CALCIUM 9.1  --  8.8* 9.1  PROT  --  6.1* 6.1* 6.2*  ALBUMIN  --  3.5 3.3* 3.3*  AST  --  58* 43* 34  ALT  --  74* 53* 42  ALKPHOS  --  83 69 67  BILITOT  --  1.1 1.4* 1.2  GFRNONAA >60  --  >60 >60  GFRAA >60  --  >60 >60  ANIONGAP 11  --  10 12     Hematology Recent Labs  Lab 07/14/18 0355 07/15/18 0247 07/16/18 0618  WBC 9.2 7.6 7.6  RBC 5.60 4.92 5.20  HGB 17.5* 15.4 16.1  HCT 52.2* 45.4 47.8  MCV 93.2 92.3 91.9  MCH 31.3 31.3 31.0  MCHC 33.5 33.9 33.7  RDW 13.6 13.6 13.3  PLT 223 169 172    Cardiac Enzymes Recent Labs  Lab 07/14/18 0635 07/14/18 1503 07/14/18 2109 07/15/18 0247  TROPONINI 0.22* 0.18* 0.22* 0.19*    BNP Recent Labs  Lab 07/14/18 0355  BNP 255.2*     DDimer  Recent Labs  Lab 07/14/18 1029  DDIMER 0.88*     Radiology    Dg Chest [facility] 1 [address]  Result Date: 07/16/2018 CLINICAL DATA:  Respiratory failure.  COVID-19. EXAM: PORTABLE CHEST 1 VIEW COMPARISON:  CT 07/14/2018.  Chest x-ray 07/14/2018. FINDINGS: Mediastinum and hilar structures normal. Stable cardiomegaly. Interim significant improvement in aeration of both lungs. No pleural effusion or pneumothorax noted. Right costophrenic angle incompletely imaged. IMPRESSION: 1.  Stable cardiomegaly. 2. Interim significant improvement in aeration of both lungs with resolution of bilateral pulmonary infiltrates/edema. Electronically Signed   By: Marcello Moores  Register   On: 07/16/2018 07:48    Patient Profile     43 y.o. male with past medical history of hypertension, obstructive sleep apnea, morbid obesity for evaluation of acute dyspnea, hemoptysis and elevated troponin.  Patient had cardiac catheterization August 2011 that showed normal coronaries and normal LV function.  Echocardiogram this admission shows normal LV function.  Troponin is 0.18, 0.22 and 0.19.  Initial COVID testing negative x2.  Assessment & Plan    1. Dyspnea-etiology of presentation unclear.  Patient presented  with abrupt onset of dyspnea and also with significant hemoptysis.  There was potential COVID exposure at work.  Initial chest CT showed pulmonary infiltrates.  However COVID testing negative x2.  Infectious disease recommends repeat testing tomorrow.  Patient also significantly hypertensive at time of admission and has improved with diuresis.  Chest x-ray much improved following Lasix.  Presentation may have been pulmonary edema related to hypertensive urgency though degree of hemoptysis would be unusual.  Echocardiogram shows normal LV function.  Continue Lasix at present dose.  Pulmonary is also following and has ordered other tests for inflammatory states. 2. Chest tightness-patient has had longstanding intermittent chest pain that is atypical.  His symptoms have been present since 2011 and at that time he had cardiac catheterization revealing normal coronary arteries.  His troponin is minimally elevated but there is no clear trend and does not appear to be consistent with acute coronary syndrome.  Echocardiogram shows normal LV function.  Electrocardiogram shows new lateral T wave inversion.  We will await follow-up COVID testing.  If negative tomorrow then will arrange [place] nuclear study for risk stratification.  Continue ASA and statin. 3. Hypertension-patient's blood pressure remains elevated.  Add amlodipine 5 mg daily and follow. 4. Morbid obesity-needs weight loss. 5. Elevated liver functions-FU LFTs normal.  For questions or updates, please contact [place] Please consult [email or web address] for contact info under        Signed, Kirk Ruths, MD  07/16/2018, 10:08 AM

## 2018-07-16 NOTE — Progress Notes (Signed)
PROGRESS NOTE  Justin Perez CXK:[id number] DOB: [birth year] DOA: 07/14/2018 PCP: London Pepper, MD  Brief history:   Per pt, he wasn't feeling so well around 0830 pm last night.  States acute onset sob that awoke him from his sleep.  He initially called 911, but then, after sitting upright, felt like he could take adequate breaths.  Denies fevers.  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   HPI/Recap of past 24 hours:  Patient is under rule out for covid 19, tele visit utilized to reduced exposure and conserve PPE  He has less intermittent chest pain, less hemoptysis, edema is coming down, 1.6liter urine output last 24hrs,    Assessment/Plan: Principal Problem:   Acute respiratory failure with hypoxia ([place]) Active Problems:   Chest pain in adult   Suspected Covid-19 Virus Infection   Essential hypertension   Elevated troponin   Morbid obesity with BMI of 50.0-59.9, adult (HCC)   OSA on CPAP   Hyperglycemia  hemoptysis, bilateral lung infiltrate, acute hypoxic respiratory failure Echo with preserved EF Troponin mild flat Now sure if cardiogenic vs primary lung , he is getting iv lasix, seems getting better with iv lasix, he is started on asa and statin Cardiology and pulmonology consulted, will follow recommendations I have discussed with ID will keep patient as PUL for covid19, will repeat SARSCOV2 test on Saturday.  HTN urgency bp 181/121 on presentation with chest pain, troponin elevation He does not take may bp meds at home, he is on iv lasix currently bp is improving   Morbid obesity/OSA on cpap Body mass index is 56.73 kg/m.   Code Status: full  Family Communication: patient   Disposition Plan: not ready to discharge   Consultants:  Cardiology  Pulmonology   Phone conversation with infectious disease Dr Johnnye Sima   Procedures:  none  Antibiotics:  none   Objective:  BP (!) 159/105   Pulse 85   Temp 98.2 F (36.8 C) (Oral)   Resp 19   Ht 5' 11"  (1.803 m)   Wt (!) 184.5 kg   SpO2 100%   BMI 56.73 kg/m   Intake/Output Summary (Last 24 hours) at 07/16/2018 1009 Last data filed at 07/16/2018 9702 Gross per 24 hour  Intake 243 ml  Output 2300 ml  Net -2057 ml   Filed Weights   07/14/18 1435 07/15/18 0540  Weight: (!) 172.9 kg (!) 184.5 kg    Exam:   General:  NAD, obese   Cardiovascular: sinus rhythm on tele  Respiratory: no increased respiratory effort, no hypoxia, no cough noted during tele vitis  Neuro: alert, oriented   Data Reviewed: Basic Metabolic Panel: Recent Labs  Lab 07/14/18 0355 07/15/18 0247 07/16/18 0618  NA 140 141 140  K 4.2 3.6 3.6  CL 103 103 100  CO2 26 28 28   GLUCOSE 130* 106* 100*  BUN 14 10 10   CREATININE 1.11 1.00 0.94  CALCIUM 9.1 8.8* 9.1  MG  --  1.8 1.9  PHOS  --  3.7 2.9   Liver Function Tests: Recent Labs  Lab 07/14/18 1029 07/15/18 0247 07/16/18 0618  AST 58* 43* 34  ALT 74* 53* 42  ALKPHOS 83 69 67  BILITOT 1.1 1.4* 1.2  PROT 6.1* 6.1* 6.2*  ALBUMIN 3.5 3.3* 3.3*   No results for input(s): LIPASE, AMYLASE in the last 168 hours. No results for input(s): AMMONIA in the last 168 hours.  CBC: Recent Labs  Lab 07/14/18 0355 07/15/18 0247 07/16/18 0618  WBC 9.2 7.6 7.6  NEUTROABS 5.6 4.9 4.7  HGB 17.5* 15.4 16.1  HCT 52.2* 45.4 47.8  MCV 93.2 92.3 91.9  PLT 223 169 172   Cardiac Enzymes:   Recent Labs  Lab 07/14/18 0635 07/14/18 1503 07/14/18 2109 07/15/18 0247  TROPONINI 0.22* 0.18* 0.22* 0.19*   BNP (last 3 results) Recent Labs    07/14/18 0355  BNP 255.2*    ProBNP (last 3 results) No results for input(s): PROBNP in the last 8760 hours.  CBG: Recent Labs  Lab 07/15/18 0810 07/15/18 1153 07/15/18 1625 07/15/18 2116 07/16/18 0817  GLUCAP 112* 93 92 105* 106*    Recent Results (from the past 240 hour(s))  SARS Coronavirus 2 (CEPHEID - Performed in  [facility] hospital lab), Hosp Order     Status: None   Collection Time: 07/14/18  4:45 AM  Result Value Ref Range Status   SARS Coronavirus 2 NEGATIVE NEGATIVE Final    Comment: (NOTE) If result is NEGATIVE SARS-CoV-2 target nucleic acids are NOT DETECTED. The SARS-CoV-2 RNA is generally detectable in upper and lower  respiratory specimens during the acute phase of infection. The lowest  concentration of SARS-CoV-2 viral copies this assay can detect is 250  copies / mL. A negative result does not preclude SARS-CoV-2 infection  and should not be used as the sole basis for treatment or other  patient management decisions.  A negative result may occur with  improper specimen collection / handling, submission of specimen other  than nasopharyngeal swab, presence of viral mutation(s) within the  areas targeted by this assay, and inadequate number of viral copies  (<250 copies / mL). A negative result must be combined with clinical  observations, patient history, and epidemiological information. If result is POSITIVE SARS-CoV-2 target nucleic acids are DETECTED. The SARS-CoV-2 RNA is generally detectable in upper and lower  respiratory specimens dur ing the acute phase of infection.  Positive  results are indicative of active infection with SARS-CoV-2.  Clinical  correlation with patient history and other diagnostic information is  necessary to determine patient infection status.  Positive results do  not rule out bacterial infection or co-infection with other viruses. If result is PRESUMPTIVE POSTIVE SARS-CoV-2 nucleic acids MAY BE PRESENT.   A presumptive positive result was obtained on the submitted specimen  and confirmed on repeat testing.  While 2019 novel coronavirus  (SARS-CoV-2) nucleic acids may be present in the submitted sample  additional confirmatory testing may be necessary for epidemiological  and / or clinical management purposes  to differentiate between  SARS-CoV-2  and other Sarbecovirus currently known to infect humans.  If clinically indicated additional testing with an alternate test  methodology [phone]) is advised. The SARS-CoV-2 RNA is generally  detectable in upper and lower respiratory sp ecimens during the acute  phase of infection. The expected result is Negative. Fact Sheet for Patients:  StrictlyIdeas.no Fact Sheet for Healthcare Providers: BankingDealers.co.za This test is not yet approved or cleared by the [place] FDA and has been authorized for detection and/or diagnosis of SARS-CoV-2 by FDA under an Emergency Use Authorization (EUA).  This EUA will remain in effect (meaning this test can be used) for the duration of the COVID-19 declaration under Section 564(b)(1) of the Act, 21 U.S.C. section 360bbb-3(b)(1), unless the authorization is terminated or revoked sooner. Performed at [facility], [place] [address] [place], [place] [address]  SARS Coronavirus 2 [facility] order, Performed in [facility] hospital lab)     Status: None   Collection Time: 07/14/18  6:20 PM  Result Value Ref Range Status   SARS Coronavirus 2 NEGATIVE NEGATIVE Final    Comment: (NOTE) If result is NEGATIVE SARS-CoV-2 target nucleic acids are NOT DETECTED. The SARS-CoV-2 RNA is generally detectable in upper and lower  respiratory specimens during the acute phase of infection. The lowest  concentration of SARS-CoV-2 viral copies this assay can detect is 250  copies / mL. A negative result does not preclude SARS-CoV-2 infection  and should not be used as the sole basis for treatment or other  patient management decisions.  A negative result may occur with  improper specimen collection / handling, submission of specimen other  than nasopharyngeal swab, presence of viral mutation(s) within the  areas targeted by this assay, and inadequate number of viral copies  (<250 copies / mL). A negative result  must be combined with clinical  observations, patient history, and epidemiological information. If result is POSITIVE SARS-CoV-2 target nucleic acids are DETECTED. The SARS-CoV-2 RNA is generally detectable in upper and lower  respiratory specimens dur ing the acute phase of infection.  Positive  results are indicative of active infection with SARS-CoV-2.  Clinical  correlation with patient history and other diagnostic information is  necessary to determine patient infection status.  Positive results do  not rule out bacterial infection or co-infection with other viruses. If result is PRESUMPTIVE POSTIVE SARS-CoV-2 nucleic acids MAY BE PRESENT.   A presumptive positive result was obtained on the submitted specimen  and confirmed on repeat testing.  While 2019 novel coronavirus  (SARS-CoV-2) nucleic acids may be present in the submitted sample  additional confirmatory testing may be necessary for epidemiological  and / or clinical management purposes  to differentiate between  SARS-CoV-2 and other Sarbecovirus currently known to infect humans.  If clinically indicated additional testing with an alternate test  methodology [phone]) is advised. The SARS-CoV-2 RNA is generally  detectable in upper and lower respiratory sp ecimens during the acute  phase of infection. The expected result is Negative. Fact Sheet for Patients:  StrictlyIdeas.no Fact Sheet for Healthcare Providers: BankingDealers.co.za This test is not yet approved or cleared by the [place] FDA and has been authorized for detection and/or diagnosis of SARS-CoV-2 by FDA under an Emergency Use Authorization (EUA).  This EUA will remain in effect (meaning this test can be used) for the duration of the COVID-19 declaration under Section 564(b)(1) of the Act, 21 U.S.C. section 360bbb-3(b)(1), unless the authorization is terminated or revoked sooner. Performed at [facility], [place] [address], [place], [place] [address]   MRSA PCR Screening     Status: None   Collection Time: 07/15/18  5:26 AM  Result Value Ref Range Status   MRSA by PCR NEGATIVE NEGATIVE Final    Comment:        The GeneXpert MRSA Assay (FDA approved for NASAL specimens only), is one component of a comprehensive MRSA colonization surveillance program. It is not intended to diagnose MRSA infection nor to guide or monitor treatment for MRSA infections. Performed at [facility], [place] [address], [place], [place] [address]   Respiratory Panel by PCR     Status: None   Collection Time: 07/15/18  4:41 PM  Result Value Ref Range Status   Adenovirus NOT DETECTED NOT DETECTED Final   Coronavirus 229E NOT DETECTED NOT DETECTED  Final    Comment: (NOTE) The Coronavirus on the Respiratory Panel, DOES NOT test for the novel  Coronavirus (2019 nCoV)    Coronavirus HKU1 NOT DETECTED NOT DETECTED Final   Coronavirus NL63 NOT DETECTED NOT DETECTED Final   Coronavirus OC43 NOT DETECTED NOT DETECTED Final   Metapneumovirus NOT DETECTED NOT DETECTED Final   Rhinovirus / Enterovirus NOT DETECTED NOT DETECTED Final   Influenza A NOT DETECTED NOT DETECTED Final   Influenza B NOT DETECTED NOT DETECTED Final   Parainfluenza Virus 1 NOT DETECTED NOT DETECTED Final   Parainfluenza Virus 2 NOT DETECTED NOT DETECTED Final   Parainfluenza Virus 3 NOT DETECTED NOT DETECTED Final   Parainfluenza Virus 4 NOT DETECTED NOT DETECTED Final   Respiratory Syncytial Virus NOT DETECTED NOT DETECTED Final   Bordetella pertussis NOT DETECTED NOT DETECTED Final   Chlamydophila pneumoniae NOT DETECTED NOT DETECTED Final   Mycoplasma pneumoniae NOT DETECTED NOT DETECTED Final    Comment: Performed at [facility], [place] [address] [place], [place] [address]     Studies: Dg Chest Port 1 View  Result Date: 07/16/2018 CLINICAL DATA:  Respiratory failure.  COVID-19. EXAM: PORTABLE CHEST 1 VIEW  COMPARISON:  CT 07/14/2018.  Chest x-ray 07/14/2018. FINDINGS: Mediastinum and hilar structures normal. Stable cardiomegaly. Interim significant improvement in aeration of both lungs. No pleural effusion or pneumothorax noted. Right costophrenic angle incompletely imaged. IMPRESSION: 1.  Stable cardiomegaly. 2. Interim significant improvement in aeration of both lungs with resolution of bilateral pulmonary infiltrates/edema. Electronically Signed   By: Marcello Moores  Register   On: 07/16/2018 07:48    Scheduled Meds: . aspirin EC  81 mg Oral Daily  . atorvastatin  40 mg Oral q1800  . enoxaparin (LOVENOX) injection  40 mg Subcutaneous Q24H  . furosemide  20 mg Intravenous Daily  . insulin aspart  0-15 Units Subcutaneous TID WC  . sodium chloride flush  3 mL Intravenous Q12H    Continuous Infusions: . sodium chloride 250 mL (07/15/18 2137)     Time spent: 25 I have personally reviewed and interpreted on  07/16/2018 daily labs, tele strips, imagings as discussed above under date review session and assessment and plans.  I reviewed all nursing notes, pharmacy notes, consultant notes,  vitals, pertinent old records  I have discussed plan of care as described above with RN , patient  on 07/16/2018   Florencia Reasons MD, PhD  [facility] Pager [phone]. If 7PM-7AM, please contact night-coverage at [email or web address], password [facility] 07/16/2018, 10:09 AM  LOS: 2 days

## 2018-07-17 DIAGNOSIS — J810 Acute pulmonary edema: Secondary | ICD-10-CM

## 2018-07-17 DIAGNOSIS — I10 Essential (primary) hypertension: Secondary | ICD-10-CM

## 2018-07-17 LAB — CBC WITH DIFFERENTIAL/PLATELET
Abs Immature Granulocytes: 0.03 10*3/uL (ref 0.00–0.07)
Basophils Absolute: 0 10*3/uL (ref 0.0–0.1)
Basophils Relative: 0 %
Eosinophils Absolute: 0.3 10*3/uL (ref 0.0–0.5)
Eosinophils Relative: 4 %
HCT: 48.5 % (ref 39.0–52.0)
Hemoglobin: 16.5 g/dL (ref 13.0–17.0)
Immature Granulocytes: 0 %
Lymphocytes Relative: 27 %
Lymphs Abs: 2.1 10*3/uL (ref 0.7–4.0)
MCH: 31.1 pg (ref 26.0–34.0)
MCHC: 34 g/dL (ref 30.0–36.0)
MCV: 91.3 fL (ref 80.0–100.0)
Monocytes Absolute: 0.6 10*3/uL (ref 0.1–1.0)
Monocytes Relative: 8 %
Neutro Abs: 4.5 10*3/uL (ref 1.7–7.7)
Neutrophils Relative %: 61 %
Platelets: 169 10*3/uL (ref 150–400)
RBC: 5.31 MIL/uL (ref 4.22–5.81)
RDW: 13.3 % (ref 11.5–15.5)
WBC: 7.6 10*3/uL (ref 4.0–10.5)
nRBC: 0 % (ref 0.0–0.2)

## 2018-07-17 LAB — GLUCOSE, CAPILLARY
Glucose-Capillary: 84 mg/dL (ref 70–99)
Glucose-Capillary: 86 mg/dL (ref 70–99)

## 2018-07-17 LAB — URINALYSIS, ROUTINE W REFLEX MICROSCOPIC
Bacteria, UA: NONE SEEN
Bilirubin Urine: NEGATIVE
Glucose, UA: NEGATIVE mg/dL
Ketones, ur: NEGATIVE mg/dL
Leukocytes,Ua: NEGATIVE
Nitrite: NEGATIVE
Protein, ur: NEGATIVE mg/dL
Specific Gravity, Urine: 1.015 (ref 1.005–1.030)
pH: 6 (ref 5.0–8.0)

## 2018-07-17 LAB — ANGIOTENSIN CONVERTING ENZYME: Angiotensin-Converting Enzyme: 56 U/L (ref 14–82)

## 2018-07-17 LAB — COMPREHENSIVE METABOLIC PANEL
ALT: 44 U/L (ref 0–44)
AST: 38 U/L (ref 15–41)
Albumin: 3.3 g/dL — ABNORMAL LOW (ref 3.5–5.0)
Alkaline Phosphatase: 66 U/L (ref 38–126)
Anion gap: 10 (ref 5–15)
BUN: 12 mg/dL (ref 6–20)
CO2: 28 mmol/L (ref 22–32)
Calcium: 9.3 mg/dL (ref 8.9–10.3)
Chloride: 100 mmol/L (ref 98–111)
Creatinine, Ser: 1.02 mg/dL (ref 0.61–1.24)
GFR calc Af Amer: >60 mL/min (ref >60)
GFR calc non Af Amer: >60 mL/min (ref >60)
Glucose, Bld: 99 mg/dL (ref 70–99)
Potassium: 3.7 mmol/L (ref 3.5–5.1)
Sodium: 138 mmol/L (ref 135–145)
Total Bilirubin: 1.1 mg/dL (ref 0.3–1.2)
Total Protein: 6.1 g/dL — ABNORMAL LOW (ref 6.5–8.1)

## 2018-07-17 LAB — MAGNESIUM: Magnesium: 1.9 mg/dL (ref 1.7–2.4)

## 2018-07-17 LAB — PHOSPHORUS: Phosphorus: 4 mg/dL (ref 2.5–4.6)

## 2018-07-17 LAB — SARS CORONAVIRUS 2 BY RT PCR (HOSPITAL ORDER, PERFORMED IN ~~LOC~~ HOSPITAL LAB): SARS Coronavirus 2: NEGATIVE

## 2018-07-17 LAB — PROCALCITONIN: Procalcitonin: <0.1 ng/mL

## 2018-07-17 LAB — CYCLIC CITRUL PEPTIDE ANTIBODY, IGG/IGA: CCP Antibodies IgG/IgA: 5 units (ref 0–19)

## 2018-07-17 MED ORDER — FUROSEMIDE 20 MG PO TABS: 20.0000 mg | ORAL_TABLET | Freq: Every day | ORAL | 0 refills | Status: DC

## 2018-07-17 MED ORDER — AMLODIPINE BESYLATE 5 MG PO TABS: 5.0000 mg | ORAL_TABLET | Freq: Every day | ORAL | 0 refills | Status: DC

## 2018-07-17 NOTE — Progress Notes (Signed)
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 Found to have acute hypoxic respiratory failure d/t bilateral pulmonary infiltrates of unclear etiology.  Possible work exposure to [employer] but testing negative x2.  ECHO with LVEF 55-60%, left atrial dilated, RA dilated.   Notes: Works as Chief Strategy Officer. Has had environmental exposures w/ possible dusts and molds w/ bathroom/construction modifications also recently did work on his [facility] system. Marland Kitchen No pets, no sig travel. No sick exposure.   Past Medical History  OSA, HTN, morbid obesity   Significant Hospital Events   5/27 Admit  5/28 PCCM consulted   Consults:  PULM and CARDS 5/28  Procedures:    Significant Diagnostic Tests:  CT chest 5/27 >>  Bilateral airspace disease and septal thickening which could bemultifocal pneumonia or edema. Negative for pulmonary embolism. ECHO 5/27 >> Left Ventricle: The left ventricle has normal systolic function, with an ejection fraction of 55-60%. The cavity size was normal. There is moderately increased left ventricular wall thickness. Left ventricular diastolic parameters we re normal.  Indeterminate filling pressures.  Right Ventricle: The right ventricle has normal systolic function. The cavity was normal. There is no increase in right ventricular wall thickness. Left Atrium: Left atrial size was moderately dilated. Right Atrium: Right atrial size was mildly dilated. Right atrial pressure is estimated at 3 mmHg. Interatrial Septum: No atrial level shunt detected by color flow Doppler. BNP 5/27 >> 244 CRP 1.4, ddimer 0.88, ferritin : 120, fibrinogen 378, LDH 259 UDS 5/28 >> negative   Micro Data:  Covid -19  5/27 X2 >> both negative    Antimicrobials:    Interim history/subjective:  Continues to feel better.  No hemoptysis noted - 5L for the hospitalization Auto-immune labs reviewed  Objective   Blood pressure 137/89, pulse 74, temperature 97.7 F (36.5 C), temperature source Oral, resp. rate 18, height 5' 11"  (1.803 m), weight (!) 184.5 kg, SpO2 97 %.        Intake/Output Summary (Last 24 hours) at 07/17/2018 1138 Last data filed at 07/17/2018 0344 Gross per 24 hour  Intake 490 ml  Output 2300 ml  Net -1810 ml   Filed Weights   07/14/18 1435 07/15/18 0540  Weight: (!) 172.9 kg (!) 184.5 kg    Examination: General: Obese man, lying in bed on room air, no distress HEENT: Oropharynx clear, moist, no lesions Neuro: Awake alert appropriate, moves all extremities, follows command, nonfocal CV: Regular, distant, no murmur PULM: Normal work of breathing, no wheezing or crackles GI: Soft, nondistended, positive bowel sounds Extremities: Trace pretibial edema bilaterally Skin: No rash  Resolved Hospital Problem list     Assessment & Plan:   Acute Hypoxic Respiratory Failure in setting of Bilateral Pulmonary Infiltrates, mild hemoptysis  Based on the evaluation thus far this is most consistent with diastolic CHF, hypertensive urgency.  COVID testing negative now x3, autoimmune evaluation reviewed today with no evidence for pulmonary vasculitis, ILD.  His fungal antibody panel is still pending, but the entire picture is inconsistent with hypersensitivity pneumonitis so this is low yield. P: Agree with good blood pressure control, cardiac work-up as indicated  OSA P: Okay to restart his CPAP therapy from my perspective  HTN P: [facility] cardiology input  Low-sodium diet, medications being titrated Cardiac stress testing planned for 5/30   Best practice:  Diet: NAS/heart healthy  Pain/Anxiety/Delirium protocol (if indicated): NA VAP protocol (if indicated): NA DVT prophylaxis: NA GI prophylaxis: ppi  Glucose control: na Mobility: OOB Code Status: full code  Family Communication: Wife updated via phone during exam by Dr. Lamonte Sakai 5/29 Disposition: Floor  Labs   CBC: Recent Labs  Lab 07/14/18 0355 07/15/18 0247 07/16/18 0618 07/17/18 0541  WBC 9.2 7.6 7.6 7.6  NEUTROABS 5.6 4.9 4.7 4.5  HGB 17.5* 15.4 16.1 16.5  HCT 52.2* 45.4 47.8 48.5  MCV 93.2 92.3 91.9 91.3  PLT 223 169 172 861    Basic Metabolic Panel: Recent Labs  Lab 07/14/18 0355 07/15/18 0247 07/16/18 0618 07/17/18 0541  NA 140 141 140 138  K 4.2 3.6 3.6 3.7  CL 103 103 100 100  CO2 26 28 28 28   GLUCOSE 130* 106* 100* 99  BUN 14 10 10 12   CREATININE 1.11 1.00 0.94 1.02  CALCIUM 9.1 8.8* 9.1 9.3  MG  --  1.8 1.9 1.9  PHOS  --  3.7 2.9 4.0   GFR: Estimated Creatinine Clearance: 158.8 mL/min (by C-G formula based on SCr of 1.02 mg/dL). Recent Labs  Lab 07/14/18 0355 07/14/18 1029 07/15/18 0247 07/15/18 1638 07/16/18 0618 07/17/18 0541  PROCALCITON  --  <0.10  --  <0.10 <0.10 <0.10  WBC 9.2  --  7.6  --  7.6 7.6    Liver Function Tests: Recent Labs  Lab 07/14/18 1029 07/15/18 0247 07/16/18 0618 07/17/18 0541  AST 58* 43* 34 38  ALT 74* 53* 42 44  ALKPHOS 83 69 67 66  BILITOT 1.1 1.4* 1.2 1.1  PROT 6.1* 6.1* 6.2* 6.1*  ALBUMIN 3.5 3.3* 3.3* 3.3*   No results for input(s): LIPASE, AMYLASE in the last 168 hours. No results for input(s): AMMONIA in the last 168 hours.  ABG No results found for: PHART, PCO2ART, PO2ART, HCO3, TCO2, ACIDBASEDEF, O2SAT   Coagulation Profile: No results for input(s): INR, PROTIME in the last 168 hours.  Cardiac Enzymes: Recent Labs  Lab 07/14/18 0635 07/14/18 1503 07/14/18 2109 07/15/18 0247  TROPONINI 0.22* 0.18* 0.22* 0.19*    HbA1C: Hgb A1c MFr Bld  Date/Time Value Ref Range Status  07/14/2018 10:29 AM 5.5 4.8 - 5.6 % Final    Comment:    (NOTE) Pre diabetes:          5.7%-6.4% Diabetes:              >6.4% Glycemic control for   <7.0%  adults with diabetes     CBG: Recent Labs  Lab 07/16/18 0817 07/16/18 1131 07/16/18 1545 07/16/18 2318 07/17/18 0820  GLUCAP 106* 84 86 117* 84     Critical care time: NA     PCCM will sign off.  Please call us if we can assist in any way.  Baltazar Apo, MD, PhD 07/17/2018, 11:43 AM [facility] [phone] or if no answer [phone]

## 2018-07-17 NOTE — Progress Notes (Signed)
[facility] will sign off.   Medication Recommendations:  DC on Lasix 20 mg daily. Other recommendations (labs, testing, etc):  May consider outpt stress test. He will need optimal BP control. Now on amlodipine with good BP control today. Follow up as an outpatient:  I will arrange for cardiology fu.

## 2018-07-17 NOTE — Discharge Summary (Signed)
Discharge Summary  Justin Perez EVO:[id number] DOB: 1975/05/21  PCP: London Pepper, MD  Admit date: 07/14/2018 Discharge date: 07/17/2018  Time spent: 64mns, more than 50% time spent on coordination of care.  Recommendations for Outpatient Follow-up:  1. F/u with PCP within a week  for hospital discharge follow up, repeat cbc/bmp at follow up, pcp to follow up on final test result including hypersensitivity pneumonitis panel and antinulear antibodies testing  2. F/u with cardiology for outpatient stress test  Discharge Diagnoses:  Active Hospital Problems   Diagnosis Date Noted   Acute respiratory failure with hypoxia ([place] 07/14/2018   Chest pain in adult 07/14/2018   Suspected Covid-19 Virus Infection 07/14/2018   Essential hypertension 07/14/2018   Elevated troponin 07/14/2018   Morbid obesity with BMI of 50.0-59.9, adult ([place] 07/14/2018   OSA on CPAP 07/14/2018   Hyperglycemia 07/14/2018    Resolved Hospital Problems  No resolved problems to display.    Discharge Condition: stable  Diet recommendation: heart healthy  Filed Weights   07/14/18 1435 07/15/18 0540  Weight: (!) 172.9 kg (!) 184.5 kg    History of present illness: (per admitting MD Dr YLorin Mercy PCP: MLondon Pepper MD Consultants:  None Patient coming from:  Home - lives with wife; NOK: Wife, [phone] Chief Complaint: SOB  HPI: Justin PAOLINIis a 43y.o. male with medical history significant of HTN; OSA on CPAP; and morbid obesity (BMI 55) presenting with SOB.   He reports having left-sided chest tightness about a week ago that last for 10 minutes and resolved spontaneously; this was similar to chest pain about 10 years that resulted in a negative cath.  His symptoms returned yesterday and were not clearly exertional in nature.  His chest tightness was associated with SOB.  Overnight, he developed markedly increased WOB and was associated with cough and hemoptysis.  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  Initial O2 sats 77% on RA and so he was placed on NRB.  Prior to my evaluating the patient, he was complaining of increased SOB and was given 4 puffs of Albuterol with improvement.  He denies h/o smoking and denies h/o known lung disease.   ED Course:  "Positive he's got COVID" with negative test, very likely false negative.  Troponin 0.22, EKG not acute.  BNP 255.2.  O2 requirement, on NRB.     Hospital Course:  Principal Problem:   Acute respiratory failure with hypoxia (HCC) Active Problems:   Chest pain in adult   Suspected Covid-19 Virus Infection   Essential hypertension   Elevated troponin   Morbid obesity with BMI of 50.0-59.9, adult (HCC)   OSA on CPAP   Hyperglycemia   acute hypoxic respiratory failure, hemoptysis, bilateral lung infiltrate, likely from acute pulmonary edema due to uncontrolled/untreated  hypertension Echo with preserved EF Troponin mild flat he received iv lasix with great improvement, hemoptysis resolved, repeat cxr "Stable cardiomegaly. 2. Interim significant improvement in aeration of both lungs with resolution of bilateral pulmonary infiltrates/edema." Cardiology and pulmonology consulted  Per pulmonology"Based on the evaluation thus far this is most consistent with diastolic CHF, hypertensive urgency.  COVID testing negative now x3, autoimmune evaluation reviewed today with no evidence for pulmonary vasculitis, ILD. " although there is still pending result "the entire picture is inconsistent with hypersensitivity  pneumonitis so this is low yield." SARSCOV2test negative x3 in the hospital.  Cardiology will arrange outpatient stress test. Per cardiology low probability of CAD due to clear cath in 2011 and no calcium store on ct chest .    HTN  urgency bp 181/121 on presentation with chest pain, troponin elevation He does not take may bp meds at home, he is on iv lasix currently bp improved on norvasc and lasix, he is discharged on these.   Morbid obesity/OSA on cpap Body mass index is 56.73 kg/m.   Code Status: full  Family Communication: patient   Disposition Plan: home, case discussed with cardiology and pulmonology prior to discharge   Consultants:  Cardiology  Pulmonology   Phone conversation with infectious disease Dr Johnnye Sima   Procedures:  none  Antibiotics:  none       Discharge Exam: BP 137/89    Pulse 74    Temp 97.7 F (36.5 C) (Oral)    Resp 18    Ht 5' 11"  (1.803 m)    Wt (!) 184.5 kg    SpO2 97%    BMI 56.73 kg/m   General: NAD Cardiovascular: RRR Respiratory: CTABL  Discharge Instructions You were cared for by a hospitalist during your hospital stay. If you have any questions about your discharge medications or the care you received while you were in the hospital after you are discharged, you can call the unit and asked to speak with the hospitalist on call if the hospitalist that took care of you is not available. Once you are discharged, your primary care physician will handle any further medical issues. Please note that NO REFILLS for any discharge medications will be authorized once you are discharged, as it is imperative that you return to your primary care physician (or establish a relationship with a primary care physician if you do not have one) for your aftercare needs so that they can reassess your need for medications and monitor your lab values.  Discharge Instructions    Diet - low sodium heart healthy   Complete by:  As directed    Increase activity slowly   Complete by:  As directed      Allergies as of 07/17/2018   No Known Allergies     Medication List    TAKE these medications   amLODipine 5 MG tablet Commonly known as:  NORVASC Take 1 tablet (5 mg  total) by mouth daily.   furosemide 20 MG tablet Commonly known as:  Lasix Take 1 tablet (20 mg total) by mouth daily for 30 days.      No Known Allergies Follow-up Information    London Pepper, MD Follow up in 1 week(s).   Specialty:  Family Medicine Why:  hospital discharge follow up, repeat cbc/bmp at follow up. please check your blood pressure at home twice a day, bring in record for your doctor to review. Contact information: [place] [address] [phone]        [place] [facility] Follow up.   Specialty:  Cardiology Why:   please call cardiology office if you do not hear from them in three business days regarding outpatient stress test. Contact information: [address] [place] [phone]           The results of significant diagnostics from this hospitalization (including imaging, microbiology, ancillary and laboratory) are listed below for reference.    Significant Diagnostic Studies:  Ct Angio Chest Pe W And/or Wo Contrast  Result Date: 07/14/2018 CLINICAL DATA:  Chest tightness.  PE suspected. EXAM: CT ANGIOGRAPHY CHEST WITH CONTRAST TECHNIQUE: Multidetector CT imaging of the chest was performed using the standard protocol during bolus administration of intravenous contrast. Multiplanar CT image reconstructions and MIPs were obtained to evaluate the vascular anatomy. CONTRAST:  151m OMNIPAQUE IOHEXOL 350 MG/ML SOLN COMPARISON:  Radiography from earlier today FINDINGS: Cardiovascular: Borderline heart size. No pericardial effusion. Negative for pulmonary embolism. No atherosclerotic calcification. Mediastinum/Nodes: Negative for adenopathy or mass. Lungs/Pleura: Bilateral airspace opacity with perihilar predilection. There is a degree of septal thickening best seen at the bases. Trace, if any, pleural fluid. Upper Abdomen: Negative Musculoskeletal: Spondylosis and disc  narrowing. Review of the MIP images confirms the above findings. IMPRESSION: 1. Bilateral airspace disease and septal thickening which could be multifocal pneumonia or edema. 2. Negative for pulmonary embolism. Electronically Signed   By: JMonte FantasiaM.D.   On: 07/14/2018 07:38   Dg Chest Port 1 View  Result Date: 07/16/2018 CLINICAL DATA:  Respiratory failure.  COVID-19. EXAM: PORTABLE CHEST 1 VIEW COMPARISON:  CT 07/14/2018.  Chest x-ray 07/14/2018. FINDINGS: Mediastinum and hilar structures normal. Stable cardiomegaly. Interim significant improvement in aeration of both lungs. No pleural effusion or pneumothorax noted. Right costophrenic angle incompletely imaged. IMPRESSION: 1.  Stable cardiomegaly. 2. Interim significant improvement in aeration of both lungs with resolution of bilateral pulmonary infiltrates/edema. Electronically Signed   By: TMarcello Moores Register   On: 07/16/2018 07:48   Dg Chest Port 1 View  Result Date: 07/14/2018 CLINICAL DATA:  Shortness of breath and cough today EXAM: PORTABLE CHEST 1 VIEW COMPARISON:  05/22/2007 FINDINGS: Cardiac shadow is mildly prominent but accentuated by the frontal technique. Some atelectatic changes are noted in the right base which improved somewhat on the second image. No other focal abnormality is noted. IMPRESSION: Mild right basilar atelectasis. Electronically Signed   By: MInez CatalinaM.D.   On: 07/14/2018 03:57    Microbiology: Recent Results (from the past 240 hour(s))  SARS Coronavirus 2 ([facility] - [facility] Hosp Order     Status: None   Collection Time: 07/14/18  4:45 AM  Result Value Ref Range Status   SARS Coronavirus 2 NEGATIVE NEGATIVE Final    Comment: (NOTE) If result is NEGATIVE SARS-CoV-2 target nucleic acids are NOT DETECTED. The SARS-CoV-2 RNA is generally detectable in upper and lower  respiratory specimens during the acute phase of infection. The lowest  concentration of SARS-CoV-2 viral  copies this assay can detect is 250  copies / mL. A negative result does not preclude SARS-CoV-2 infection  and should not be used as the sole basis for treatment or other  patient management decisions.  A negative result may occur with  improper specimen collection / handling, submission of specimen other  than nasopharyngeal swab, presence of viral mutation(s) within the  areas targeted by this assay, and inadequate number of viral copies  (<250 copies / mL). A negative result must be combined with clinical  observations, patient history, and epidemiological information. If result is POSITIVE SARS-CoV-2 target nucleic acids are DETECTED. The SARS-CoV-2 RNA is generally detectable in upper and lower  respiratory specimens dur ing the acute phase of infection.  Positive  results are indicative of active infection with SARS-CoV-2.  Clinical  correlation with patient history and other diagnostic information is  necessary to determine patient infection status.  Positive results do  not rule  out bacterial infection or co-infection with other viruses. If result is PRESUMPTIVE POSTIVE SARS-CoV-2 nucleic acids MAY BE PRESENT.   A presumptive positive result was obtained on the submitted specimen  and confirmed on repeat testing.  While 2019 novel coronavirus  (SARS-CoV-2) nucleic acids may be present in the submitted sample  additional confirmatory testing may be necessary for epidemiological  and / or clinical management purposes  to differentiate between  SARS-CoV-2 and other Sarbecovirus currently known to infect humans.  If clinically indicated additional testing with an alternate test  methodology [phone]) is advised. The SARS-CoV-2 RNA is generally  detectable in upper and lower respiratory sp ecimens during the acute  phase of infection. The expected result is Negative. Fact Sheet for Patients:  StrictlyIdeas.no Fact Sheet for Healthcare  Providers: BankingDealers.co.za This test is not yet approved or cleared by the [place] FDA and has been authorized for detection and/or diagnosis of SARS-CoV-2 by FDA under an Emergency Use Authorization (EUA).  This EUA will remain in effect (meaning this test can be used) for the duration of the COVID-19 declaration under Section 564(b)(1) of the Act, 21 U.S.C. section 360bbb-3(b)(1), unless the authorization is terminated or revoked sooner. Performed at [facility], [place] [address], [place], [place] [address]   SARS Coronavirus 2 [facility] order, Performed in [facility] hospital lab)     Status: None   Collection Time: 07/14/18  6:20 PM  Result Value Ref Range Status   SARS Coronavirus 2 NEGATIVE NEGATIVE Final    Comment: (NOTE) If result is NEGATIVE SARS-CoV-2 target nucleic acids are NOT DETECTED. The SARS-CoV-2 RNA is generally detectable in upper and lower  respiratory specimens during the acute phase of infection. The lowest  concentration of SARS-CoV-2 viral copies this assay can detect is 250  copies / mL. A negative result does not preclude SARS-CoV-2 infection  and should not be used as the sole basis for treatment or other  patient management decisions.  A negative result may occur with  improper specimen collection / handling, submission of specimen other  than nasopharyngeal swab, presence of viral mutation(s) within the  areas targeted by this assay, and inadequate number of viral copies  (<250 copies / mL). A negative result must be combined with clinical  observations, patient history, and epidemiological information. If result is POSITIVE SARS-CoV-2 target nucleic acids are DETECTED. The SARS-CoV-2 RNA is generally detectable in upper and lower  respiratory specimens dur ing the acute phase of infection.  Positive  results are indicative of active infection with SARS-CoV-2.  Clinical  correlation with patient history and other  diagnostic information is  necessary to determine patient infection status.  Positive results do  not rule out bacterial infection or co-infection with other viruses. If result is PRESUMPTIVE POSTIVE SARS-CoV-2 nucleic acids MAY BE PRESENT.   A presumptive positive result was obtained on the submitted specimen  and confirmed on repeat testing.  While 2019 novel coronavirus  (SARS-CoV-2) nucleic acids may be present in the submitted sample  additional confirmatory testing may be necessary for epidemiological  and / or clinical management purposes  to differentiate between  SARS-CoV-2 and other Sarbecovirus currently known to infect humans.  If clinically indicated additional testing with an alternate test  methodology [phone]) is advised. The SARS-CoV-2 RNA is generally  detectable in upper and lower respiratory sp ecimens during the acute  phase of infection. The expected result is Negative. Fact Sheet for Patients:  StrictlyIdeas.no Fact Sheet for Healthcare  Providers: BankingDealers.co.za This test is not yet approved or cleared by the [place] and has been authorized for detection and/or diagnosis of SARS-CoV-2 by FDA under an Emergency Use Authorization (EUA).  This EUA will remain in effect (meaning this test can be used) for the duration of the COVID-19 declaration under Section 564(b)(1) of the Act, 21 U.S.C. section 360bbb-3(b)(1), unless the authorization is terminated or revoked sooner. Performed at [facility], [place] [address], [place], [place] [address]   MRSA PCR Screening     Status: None   Collection Time: 07/15/18  5:26 AM  Result Value Ref Range Status   MRSA by PCR NEGATIVE NEGATIVE Final    Comment:        The GeneXpert MRSA Assay (FDA approved for NASAL specimens only), is one component of a comprehensive MRSA colonization surveillance program. It is not intended to diagnose MRSA infection nor  to guide or monitor treatment for MRSA infections. Performed at [facility], [place] [address], [place], [place] [address]   Respiratory Panel by PCR     Status: None   Collection Time: 07/15/18  4:41 PM  Result Value Ref Range Status   Adenovirus NOT DETECTED NOT DETECTED Final   Coronavirus 229E NOT DETECTED NOT DETECTED Final    Comment: (NOTE) The Coronavirus on the Respiratory Panel, DOES NOT test for the novel  Coronavirus (2019 nCoV)    Coronavirus HKU1 NOT DETECTED NOT DETECTED Final   Coronavirus NL63 NOT DETECTED NOT DETECTED Final   Coronavirus OC43 NOT DETECTED NOT DETECTED Final   Metapneumovirus NOT DETECTED NOT DETECTED Final   Rhinovirus / Enterovirus NOT DETECTED NOT DETECTED Final   Influenza A NOT DETECTED NOT DETECTED Final   Influenza B NOT DETECTED NOT DETECTED Final   Parainfluenza Virus 1 NOT DETECTED NOT DETECTED Final   Parainfluenza Virus 2 NOT DETECTED NOT DETECTED Final   Parainfluenza Virus 3 NOT DETECTED NOT DETECTED Final   Parainfluenza Virus 4 NOT DETECTED NOT DETECTED Final   Respiratory Syncytial Virus NOT DETECTED NOT DETECTED Final   Bordetella pertussis NOT DETECTED NOT DETECTED Final   Chlamydophila pneumoniae NOT DETECTED NOT DETECTED Final   Mycoplasma pneumoniae NOT DETECTED NOT DETECTED Final    Comment: Performed at [facility], [place]. [address] [place], [place] [address]  SARS Coronavirus 2 (CEPHEID- Performed in [place] hospital lab), Hosp Order     Status: None   Collection Time: 07/17/18  8:44 AM  Result Value Ref Range Status   SARS Coronavirus 2 NEGATIVE NEGATIVE Final    Comment: (NOTE) If result is NEGATIVE SARS-CoV-2 target nucleic acids are NOT DETECTED. The SARS-CoV-2 RNA is generally detectable in upper and lower  respiratory specimens during the acute phase of infection. The lowest  concentration of SARS-CoV-2 viral copies this assay can detect is 250  copies / mL. A negative result does not preclude  SARS-CoV-2 infection  and should not be used as the sole basis for treatment or other  patient management decisions.  A negative result may occur with  improper specimen collection / handling, submission of specimen other  than nasopharyngeal swab, presence of viral mutation(s) within the  areas targeted by this assay, and inadequate number of viral copies  (<250 copies / mL). A negative result must be combined with clinical  observations, patient history, and epidemiological information. If result is POSITIVE SARS-CoV-2 target nucleic acids are DETECTED. The SARS-CoV-2 RNA is generally detectable in upper and  lower  respiratory specimens dur ing the acute phase of infection.  Positive  results are indicative of active infection with SARS-CoV-2.  Clinical  correlation with patient history and other diagnostic information is  necessary to determine patient infection status.  Positive results do  not rule out bacterial infection or co-infection with other viruses. If result is PRESUMPTIVE POSTIVE SARS-CoV-2 nucleic acids MAY BE PRESENT.   A presumptive positive result was obtained on the submitted specimen  and confirmed on repeat testing.  While 2019 novel coronavirus  (SARS-CoV-2) nucleic acids may be present in the submitted sample  additional confirmatory testing may be necessary for epidemiological  and / or clinical management purposes  to differentiate between  SARS-CoV-2 and other Sarbecovirus currently known to infect humans.  If clinically indicated additional testing with an alternate test  methodology [phone]) is advised. The SARS-CoV-2 RNA is generally  detectable in upper and lower respiratory sp ecimens during the acute  phase of infection. The expected result is Negative. Fact Sheet for Patients:  StrictlyIdeas.no Fact Sheet for Healthcare Providers: BankingDealers.co.za This test is not yet approved or cleared by the  [place] FDA and has been authorized for detection and/or diagnosis of SARS-CoV-2 by FDA under an Emergency Use Authorization (EUA).  This EUA will remain in effect (meaning this test can be used) for the duration of the COVID-19 declaration under Section 564(b)(1) of the Act, 21 U.S.C. section 360bbb-3(b)(1), unless the authorization is terminated or revoked sooner. Performed at [facility], [place] [address] [place], [place] [address]      Labs: Basic Metabolic Panel: Recent Labs  Lab 07/14/18 0355 07/15/18 0247 07/16/18 0618 07/17/18 0541  NA 140 141 140 138  K 4.2 3.6 3.6 3.7  CL 103 103 100 100  CO2 26 28 28 28   GLUCOSE 130* 106* 100* 99  BUN 14 10 10 12   CREATININE 1.11 1.00 0.94 1.02  CALCIUM 9.1 8.8* 9.1 9.3  MG  --  1.8 1.9 1.9  PHOS  --  3.7 2.9 4.0   Liver Function Tests: Recent Labs  Lab 07/14/18 1029 07/15/18 0247 07/16/18 0618 07/17/18 0541  AST 58* 43* 34 38  ALT 74* 53* 42 44  ALKPHOS 83 69 67 66  BILITOT 1.1 1.4* 1.2 1.1  PROT 6.1* 6.1* 6.2* 6.1*  ALBUMIN 3.5 3.3* 3.3* 3.3*   No results for input(s): LIPASE, AMYLASE in the last 168 hours. No results for input(s): AMMONIA in the last 168 hours. CBC: Recent Labs  Lab 07/14/18 0355 07/15/18 0247 07/16/18 0618 07/17/18 0541  WBC 9.2 7.6 7.6 7.6  NEUTROABS 5.6 4.9 4.7 4.5  HGB 17.5* 15.4 16.1 16.5  HCT 52.2* 45.4 47.8 48.5  MCV 93.2 92.3 91.9 91.3  PLT 223 169 172 169   Cardiac Enzymes: Recent Labs  Lab 07/14/18 0635 07/14/18 1503 07/14/18 2109 07/15/18 0247  TROPONINI 0.22* 0.18* 0.22* 0.19*   BNP: BNP (last 3 results) Recent Labs    07/14/18 0355  BNP 255.2*    ProBNP (last 3 results) No results for input(s): PROBNP in the last 8760 hours.  CBG: Recent Labs  Lab 07/16/18 1131 07/16/18 1545 07/16/18 2318 07/17/18 0820 07/17/18 1141  GLUCAP 84 86 117* 84 86       Signed:  Florencia Reasons MD, PhD  [facility] 07/17/2018, 1:52 PM

## 2018-07-19 LAB — HYPERSENSITIVITY PNEUMONITIS
A. Pullulans Abs: NEGATIVE
A.Fumigatus #1 Abs: NEGATIVE
Micropolyspora faeni, IgG: NEGATIVE
Pigeon Serum Abs: NEGATIVE
Thermoact. Saccharii: NEGATIVE
Thermoactinomyces vulgaris, IgG: NEGATIVE

## 2018-07-19 LAB — ANTINUCLEAR ANTIBODIES, IFA: ANA Ab, IFA: NEGATIVE

## 2018-07-20 ENCOUNTER — Other Ambulatory Visit: Payer: Self-pay

## 2018-07-20 ENCOUNTER — Ambulatory Visit: Payer: 59 | Admitting: Adult Health

## 2018-07-20 ENCOUNTER — Encounter: Payer: Self-pay | Admitting: Adult Health

## 2018-07-20 VITALS — BP 144/102 | HR 63 | Temp 98.2°F | Ht 71.0 in | Wt 398.8 lb

## 2018-07-20 DIAGNOSIS — G4733 Obstructive sleep apnea (adult) (pediatric): Secondary | ICD-10-CM | POA: Diagnosis not present

## 2018-07-20 DIAGNOSIS — R079 Chest pain, unspecified: Secondary | ICD-10-CM | POA: Diagnosis not present

## 2018-07-20 DIAGNOSIS — I10 Essential (primary) hypertension: Secondary | ICD-10-CM | POA: Diagnosis not present

## 2018-07-20 DIAGNOSIS — Z9989 Dependence on other enabling machines and devices: Secondary | ICD-10-CM

## 2018-07-20 DIAGNOSIS — I5032 Chronic diastolic (congestive) heart failure: Secondary | ICD-10-CM | POA: Diagnosis not present

## 2018-07-20 MED ORDER — AMLODIPINE BESYLATE 10 MG PO TABS: 10.0000 mg | ORAL_TABLET | Freq: Every day | ORAL | 6 refills | Status: DC

## 2018-07-20 NOTE — Patient Instructions (Addendum)
Medication Instructions:  INCREASE AMLODIPINE 10MG DAILY If you need a refill on your cardiac medications before your next appointment, please call your pharmacy.  Labwork: BMP ON THE DAY OF STRESS TESTING HERE IN OUR OFFICE AT [facility]   You will NOT need to fast   Take the provided lab slips with you to the lab for your blood draw.   When you have your labs (blood work) drawn today and your tests are completely normal, you will receive your results only by MyChart Message (if you have MyChart) -OR-  A paper copy in the mail.  If you have any lab test that is abnormal or we need to change your treatment, we will call you to review these results.  Testing/Procedures: Your physician has requested that you have an Exercise Myoview. A cardiac stress test is a cardiological test that measures the heart's ability to respond to external stress in a controlled clinical environment. The stress response is induced by exercise (exercise-treadmill). For further information please visit HugeFiesta.tn. If you have questions or concerns about your appointment, you can call the Nuclear Lab at [phone].     MAKE SURE YOU DO COVID TESTING 3 DAYS PRIOR  Follow-Up: You will need a follow up appointment in [place].   You may see Kirk Ruths, MD , Jory Sims, DNP, AACC or one of the following Advanced Practice Providers on your designated Care Team:  Almyra Deforest, PA-C  Fabian Sharp, PA-C       At [facility], you and your health needs are our priority.  As part of our continuing mission to provide you with exceptional heart care, we have created designated Provider Care Teams.  These Care Teams include your primary Cardiologist (physician) and Advanced Practice Providers (APPs -  Physician Assistants and Nurse Practitioners) who all work together to provide you with the care you need, when you need it.  Thank you for choosing [facility] at [facility]!!

## 2018-07-20 NOTE — Progress Notes (Signed)
Cardiology Office Note   Date:  07/20/2018   ID:  [name], DOB 1976-02-03, MRN [id number]  PCP:  London Pepper, MD  Cardiologist:  Dr. Stanford Breed     History of Present Illness: Justin Perez is a 43 y.o. male who presents for post hospital follow up after admission for acute respiratory failure with coughing and hemoptysis, with O2 sat's of 77% on RA. He was tested for COVID and found to be negative.  He was found to have pulmonary edema.due to uncontrolled hypertension. He was treated with diuretics and inhalers. Weight on discharge 172.9 kg (380 lbs).   He was seen by cardiology and was diagnosed with diastolic CHF, hypertensive urgency, and repeated COVID testing which was negative X 3. He was to have OP stress test arranged. He was previously seen by Dr. Einar Gip with cardiac cath completed in 2011 with normal coronary arteries.   Other history includes morbid obesity, OSA on CPAP. He was discharged on amlodipine 5 mg ,furosemide 20 mg daily. He was to have OP stress test.    He comes today hypertensive and has gained about 10 lbs. He denies LEE,DOE but has not been eating as healthy as he should, eating salted foods. 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 Less than before he was in the hospital. He has not had any more hemoptysis. He is medically compliant.   Past Medical History:  Diagnosis Date  . Hypertension   . Morbid obesity with BMI of 50.0-59.9, adult ([place])   . OSA on CPAP     Past Surgical History:  Procedure Laterality Date  . biceps surgery    . TONSILLECTOMY    . URETHRA SURGERY       Current Outpatient Medications  Medication Sig Dispense Refill  . amLODipine (NORVASC) 5 MG tablet Take 1 tablet (5 mg total) by mouth daily. 30 tablet 0  . furosemide (LASIX) 20 MG tablet Take 1 tablet (20 mg total) by mouth daily for 30 days. 30 tablet 0   No current facility-administered medications for this visit.     Allergies:   Patient has  no known allergies.    Social History:  The patient  reports that he has never smoked. He has never used smokeless tobacco. He reports current alcohol use. He reports that he does not use drugs.   Family History:  The patient's family history includes COPD in his father; Diabetes in his mother; Heart failure (age of onset: 48) in his mother.    ROS: All other systems are reviewed and negative. Unless otherwise mentioned in H&P    PHYSICAL EXAM: VS:  There were no vitals taken for this visit. , BMI There is no height or weight on file to calculate BMI. GEN: Well nourished, well developed, in no acute distress, morbidly obese  HEENT: normal Neck: no JVD, carotid bruits, or masses Cardiac: RRR; no murmurs, rubs, or gallops,no edema  Respiratory:  Clear to auscultation bilaterally, normal work of breathing GI: soft, nontender, nondistended, + BS MS: no deformity or atrophy Skin: warm and dry, no rash Neuro:  Strength and sensation are intact Psych: euthymic mood, full affect   EKG: NSR with bilateral enlargement. T- wave abnormality in V6.Marland Kitchen Rate of 63 bpm   Recent Labs: 07/14/2018: B Natriuretic Peptide 255.2; TSH 1.497 07/17/2018: ALT 44; BUN 12; Creatinine, Ser 1.02; Hemoglobin 16.5; Magnesium 1.9; Platelets 169; Potassium 3.7; Sodium 138    Lipid Panel    Component Value  Date/Time   CHOL 155 07/15/2018 0247   TRIG 95 07/15/2018 0247   HDL 32 (L) 07/15/2018 0247   CHOLHDL 4.8 07/15/2018 0247   VLDL 19 07/15/2018 0247   LDLCALC 104 (H) 07/15/2018 0247      Wt Readings from Last 3 Encounters:  07/15/18 (!) 406 lb 12 oz (184.5 kg)  05/28/17 (!) 407 lb (184.6 kg)      Other studies Reviewed: Echocardiogram 07/15/18  1. The left ventricle has normal systolic function, with an ejection fraction of 55-60%. The cavity size was normal. There is moderately increased left ventricular wall thickness. Left ventricular diastolic parameters were normal. Indeterminate filling   pressures.  2. The right ventricle has normal systolic function. The cavity was normal. There is no increase in right ventricular wall thickness.  3. Left atrial size was moderately dilated.  4. Right atrial size was mildly dilated.  5. No evidence of mitral valve stenosis.  6. No stenosis of the aortic valve.  ASSESSMENT AND PLAN:  1. Chronic diastolic CHF: He continues on lasix 20 mg daily and has not had any recurrent DOE, coughing or hemoptysis. No overt edema is noted. He has gained 10 lbs since leaving the hospital and has not been adhering to low sodium diet. He is instructed on low sodium diet. He will continue lasix and daily weights as directed. He will have a BMET ordered for evaluation of his kidney status.   2. Hypertension: BP is elevated today and he has been eating some salty foods. I will increase amlodipine to 10 mg daily, reinforced low sodium diet.   3. Chest pain: Will schedule him for a 2 day stress myoview due to body habitus due to chest pain and abnormal EKG revealing some lateral ischemia. Will follow up after stress test to discuss findings.   4. Hyperlipidemia: LDL 104. He does not have know CAD at this time. Will need to follow this.   5. OSA: On CPAP reports he is compliant with this.    Current medicines are reviewed at length with the patient today.    Labs/ tests ordered today include: BMET and [employer]  Phill Myron. [place] Pugh, ANP, AACC   07/20/2018 10:40 AM    [place] [address] Office [phone] Fax [phone]

## 2018-07-21 ENCOUNTER — Telehealth (INDEPENDENT_AMBULATORY_CARE_PROVIDER_SITE_OTHER): Payer: Self-pay | Admitting: Family Medicine

## 2018-07-21 NOTE — Telephone Encounter (Signed)
Pt tested negative 5/27 and 5/30 - no f/u needed

## 2018-07-26 ENCOUNTER — Other Ambulatory Visit: Payer: Self-pay

## 2018-07-26 ENCOUNTER — Other Ambulatory Visit (HOSPITAL_COMMUNITY)
Admission: RE | Admit: 2018-07-26 | Discharge: 2018-07-26 | Disposition: A | Discharge status: Home / Self Care | Payer: 59 | Source: Ambulatory Visit | Attending: Adult Health | Admitting: Adult Health

## 2018-07-26 DIAGNOSIS — Z1159 Encounter for screening for other viral diseases: Secondary | ICD-10-CM | POA: Insufficient documentation

## 2018-07-28 ENCOUNTER — Telehealth: Payer: Self-pay | Admitting: Cardiology

## 2018-07-28 ENCOUNTER — Telehealth (HOSPITAL_COMMUNITY): Payer: Self-pay | Admitting: *Deleted

## 2018-07-28 LAB — NOVEL CORONAVIRUS, NAA (HOSP ORDER, SEND-OUT TO REF LAB; TAT 18-24 HRS): SARS-CoV-2, NAA: NOT DETECTED

## 2018-07-28 NOTE — Telephone Encounter (Signed)
smartphone/ consent/ my chart via text/ pre reg completed

## 2018-07-28 NOTE — Telephone Encounter (Signed)
Close encounter 

## 2018-07-29 ENCOUNTER — Other Ambulatory Visit: Payer: Self-pay

## 2018-07-29 ENCOUNTER — Ambulatory Visit (HOSPITAL_COMMUNITY)
Admission: RE | Admit: 2018-07-29 | Discharge: 2018-07-29 | Disposition: A | Discharge status: Home / Self Care | Payer: 59 | Source: Ambulatory Visit | Attending: Cardiology | Admitting: Cardiology

## 2018-07-29 DIAGNOSIS — R079 Chest pain, unspecified: Secondary | ICD-10-CM | POA: Diagnosis not present

## 2018-07-29 MED ORDER — REGADENOSON 0.4 MG/5ML IV SOLN
0.4000 mg | Freq: Once | INTRAVENOUS | Status: AC
  Administered 2018-07-29: 0.4 mg via INTRAVENOUS

## 2018-07-29 MED ORDER — TECHNETIUM TC 99M TETROFOSMIN IV KIT
32.0000 | PACK | Freq: Once | INTRAVENOUS | Status: AC | PRN
  Administered 2018-07-29: 32 via INTRAVENOUS
  Filled 2018-07-29: qty 32

## 2018-07-30 ENCOUNTER — Ambulatory Visit (HOSPITAL_COMMUNITY)
Admission: RE | Admit: 2018-07-30 | Discharge: 2018-07-30 | Disposition: A | Discharge status: Home / Self Care | Payer: 59 | Source: Ambulatory Visit | Attending: Cardiovascular Disease | Admitting: Cardiovascular Disease

## 2018-07-30 LAB — MYOCARDIAL PERFUSION IMAGING
LV dias vol: 223 mL (ref 62–150)
LV sys vol: 135 mL
Peak HR: 85 {beats}/min
Rest HR: 62 {beats}/min
SDS: 3
SRS: 0
SSS: 3
TID: 1.02

## 2018-07-30 MED ORDER — TECHNETIUM TC 99M TETROFOSMIN IV KIT
29.2000 | PACK | Freq: Once | INTRAVENOUS | Status: AC | PRN
  Administered 2018-07-30: 29.2 via INTRAVENOUS

## 2018-08-02 NOTE — Progress Notes (Signed)
Virtual Visit via Video Note   This visit type was conducted due to national recommendations for restrictions regarding the COVID-19 Pandemic (e.g. social distancing) in an effort to limit this patient's exposure and mitigate transmission in our community.  Due to his co-morbid illnesses, this patient is at least at moderate risk for complications without adequate follow up.  This format is felt to be most appropriate for this patient at this time.  All issues noted in this document were discussed and addressed.  A limited physical exam was performed with this format.  Please refer to the patient's chart for his consent to telehealth for [facility].   Date:  08/03/2018   ID:  Justin Perez, DOB 10-26-1975, MRN [id number]  Patient Location: Home Provider Location: Home  PCP:  London Pepper, MD  Cardiologist:  Kirk Ruths, MD   Evaluation Performed:  Follow-Up Visit  Chief Complaint:  Chest pain  History of Present Illness:    Cardiac cath 8/11 showed normal LV function; normal coronaries.   Patient recently admitted May 2020 with abrupt onset of dyspnea and hemoptysis.  COVID was negative.  Chest CT showed bilateral airspace disease but no pulmonary embolus.  Echocardiogram showed normal LV function and biatrial enlargement.  Troponin I was 0.18, 0.22 and 0.19.  Angiotensin converting enzyme normal at 56.  Hypersensitivity pneumonitis panel negative.  Rheumatoid factor normal.  ANCA titers negative.  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  Treated with diuretics with improvement. Patient was discharged and outpatient nuclear study June 2020 showed ejection fraction 39% and no definite perfusion defects.  Calculated ejection fraction felt to underestimate LV function.  Since last seen patient denies dyspnea on exertion, orthopnea, PND, palpitations or syncope.  Occasional minimal pedal edema.  He continues to have occasional pain in his left chest area  that can last an hour at a time.  It increases with lying flat.  He has had this intermittently since his cardiac catheterization in 2011.  The patient does not have symptoms concerning for COVID-19 infection (fever, chills, cough, or new shortness of breath).    Past Medical History:  Diagnosis Date  . Hypertension   . Morbid obesity with BMI of 50.0-59.9, adult ([place])   . OSA on CPAP    Past Surgical History:  Procedure Laterality Date  . biceps surgery    . TONSILLECTOMY    . URETHRA SURGERY       Current Meds  Medication Sig  . amLODipine (NORVASC) 10 MG tablet Take 1 tablet (10 mg total) by mouth daily.  . furosemide (LASIX) 20 MG tablet Take 1 tablet (20 mg total) by mouth daily for 30 days.     Allergies:   Patient has no known allergies.   Social History   Tobacco Use  . Smoking status: Never Smoker  . Smokeless tobacco: Never Used  Substance Use Topics  . Alcohol use: Yes    Comment: occ  . Drug use: Never     Family Hx: The patient's family history includes COPD in his father; Diabetes in his mother; Heart failure (age of onset: 22) in his mother.  ROS:   Please see the history of present illness.    No fevers, chills or productive cough. All other systems reviewed and are negative.  Recent Labs: 07/14/2018: B Natriuretic Peptide 255.2; TSH 1.497 07/17/2018: ALT 44; BUN 12; Creatinine, Ser 1.02; Hemoglobin 16.5; Magnesium 1.9; Platelets 169; Potassium 3.7; Sodium 138  Recent Lipid Panel Lab Results  Component Value Date/Time   CHOL 155 07/15/2018 02:47 AM   TRIG 95 07/15/2018 02:47 AM   HDL 32 (L) 07/15/2018 02:47 AM   CHOLHDL 4.8 07/15/2018 02:47 AM   LDLCALC 104 (H) 07/15/2018 02:47 AM    Wt Readings from Last 3 Encounters:  08/03/18 (!) 390 lb (176.9 kg)  07/29/18 (!) 398 lb (180.5 kg)  07/20/18 (!) 398 lb 12.8 oz (180.9 kg)     Objective:    Vital Signs:  BP (!) 165/107   Pulse 66   Ht 5' 11"  (1.803 m)   Wt (!) 390 lb (176.9 kg)   BMI  54.39 kg/m    VITAL SIGNS:  reviewed  No acute distress Answers questions appropriately Normal affect Remainder physical examination not performed (telehealth visit; coronavirus pandemic)  ASSESSMENT & PLAN:    1. Chronic diastolic congestive heart failure-patient appears to be euvolemic clinically.  Plan to continue present dose of Lasix.  Patient instructed on importance of fluid restriction and low-sodium diet.  Check potassium and renal function. 2. Hypertension-blood pressure remains elevated despite low-dose diuretic and amlodipine.  Add losartan 50 mg daily.  Follow blood pressure and advance as needed.  Would consider labetalol if needed.  If blood pressure remains difficult to control will need to consider renal Dopplers and potential work-up for causes of secondary hypertension.  Note these would be difficult given patient size. 3. Chest pain-previous catheterization did not reveal significant coronary disease.  Recent nuclear study negative.  Symptoms are atypical.  We will not plan further ischemia evaluation at this point.  Cardiac CTA would be difficult given patient's size.  Only option would likely be catheterization. 4. Obstructive sleep apnea-we discussed the importance of continuing to be compliant with CPAP. 5. Hyperlipidemia-continue diet. 6. Morbid obesity-we discussed the importance of exercise, weight loss and diet.  COVID-19 Education: The importance of social distancing was discussed today.  Time:   Today, I have spent 17 minutes with the patient with telehealth technology discussing the above problems.     Medication Adjustments/Labs and Tests Ordered: Current medicines are reviewed at length with the patient today.  Concerns regarding medicines are outlined above.   Tests Ordered: No orders of the defined types were placed in this encounter.   Medication Changes: No orders of the defined types were placed in this encounter.   Follow Up:  Virtual Visit  or In Person in 6 month(s)  Signed, Kirk Ruths, MD  08/03/2018 9:21 AM    [place]

## 2018-08-03 ENCOUNTER — Telehealth (INDEPENDENT_AMBULATORY_CARE_PROVIDER_SITE_OTHER): Payer: 59 | Admitting: Cardiology

## 2018-08-03 ENCOUNTER — Ambulatory Visit: Payer: 59 | Admitting: Adult Health

## 2018-08-03 VITALS — BP 165/107 | HR 66 | Ht 71.0 in | Wt 390.0 lb

## 2018-08-03 DIAGNOSIS — I10 Essential (primary) hypertension: Secondary | ICD-10-CM

## 2018-08-03 DIAGNOSIS — I5032 Chronic diastolic (congestive) heart failure: Secondary | ICD-10-CM | POA: Diagnosis not present

## 2018-08-03 DIAGNOSIS — R072 Precordial pain: Secondary | ICD-10-CM

## 2018-08-03 MED ORDER — LOSARTAN POTASSIUM 50 MG PO TABS: 50.0000 mg | ORAL_TABLET | Freq: Every day | ORAL | 3 refills | Status: DC

## 2018-08-03 NOTE — Patient Instructions (Signed)
Medication Instructions:  START LOSARTAN 50 MG ONCE DAILY If you need a refill on your cardiac medications before your next appointment, please call your pharmacy.   Lab work: Your physician recommends that you return for lab work in: [place] If you have labs (blood work) drawn today and your tests are completely normal, you will receive your results only by: Marland Kitchen MyChart Message (if you have MyChart) OR . A paper copy in the mail If you have any lab test that is abnormal or we need to change your treatment, we will call you to review the results.  Follow-Up: At [facility], you and your health needs are our priority.  As part of our continuing mission to provide you with exceptional heart care, we have created designated Provider Care Teams.  These Care Teams include your primary Cardiologist (physician) and Advanced Practice Providers (APPs -  Physician Assistants and Nurse Practitioners) who all work together to provide you with the care you need, when you need it. Your physician recommends that you schedule a follow-up appointment in: [place]

## 2018-09-29 ENCOUNTER — Encounter: Payer: Self-pay | Admitting: *Deleted

## 2018-10-28 ENCOUNTER — Other Ambulatory Visit: Payer: Self-pay | Admitting: Family Medicine

## 2018-10-28 DIAGNOSIS — R109 Unspecified abdominal pain: Secondary | ICD-10-CM

## 2018-11-01 NOTE — Progress Notes (Signed)
Virtual Visit via Video Note changed to phone visit at pt request   This visit type was conducted due to national recommendations for restrictions regarding the COVID-19 Pandemic (e.g. social distancing) in an effort to limit this patient's exposure and mitigate transmission in our community.  Due to his co-morbid illnesses, this patient is at least at moderate risk for complications without adequate follow up.  This format is felt to be most appropriate for this patient at this time.  All issues noted in this document were discussed and addressed.  A limited physical exam was performed with this format.  Please refer to the patient's chart for his consent to telehealth for [facility] Justin Perez.   Date:  11/02/2018   ID:  Justin Perez, DOB 28-Jan-1976, MRN [id number]  Patient Location:Home Provider Location: Home  PCP:  London Pepper, MD  Cardiologist:  Dr Stanford Breed  Evaluation Performed:  Follow-Up Visit  Chief Complaint:  FU diastolic CHF and hypertension  History of Present Illness:    Cardiac cath 8/11 showed normal LV function; normal coronaries.  Patient recently admitted May 2020 with abrupt onset of dyspnea and hemoptysis.  COVID was negative.  Chest CT showed bilateral airspace disease but no pulmonary embolus. Echocardiogram showed normal LV function and biatrial enlargement. Troponin I was 0.18, 0.22 and 0.19.  Angiotensin converting enzyme normal at 56.  Hypersensitivity pneumonitis panel negative. Rheumatoid factor normal.  ANCA titers negative.  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  Treated with diuretics with improvement. Patient was discharged and outpatient nuclear study June 2020 showed ejection fraction 39% and no definite perfusion defects. Calculated ejection fraction felt to underestimate LV function. Patient has had occasional chest pain since his catheterization in 2011.  Labs 9/20 per pt K 4.2 and Cr .75. Since last seen patient denies CP  or dyspnea; occasional pedal edema.  The patient does not have symptoms concerning for COVID-19 infection (fever, chills, cough, or new shortness of breath).    Past Medical History:  Diagnosis Date   Hypertension    Morbid obesity with BMI of 50.0-59.9, adult ([facility])    OSA on CPAP    Past Surgical History:  Procedure Laterality Date   biceps surgery     TONSILLECTOMY     URETHRA SURGERY       Current Meds  Medication Sig   amLODipine (NORVASC) 10 MG tablet Take 1 tablet (10 mg total) by mouth daily.     Allergies:   Patient has no known allergies.   Social History   Tobacco Use   Smoking status: Never Smoker   Smokeless tobacco: Never Used  Substance Use Topics   Alcohol use: Yes    Comment: occ   Drug use: Never     Family Hx: The patient's family history includes COPD in his father; Diabetes in his mother; Heart failure (age of onset: 6) in his mother.  ROS:   Please see the history of present illness.    No Fever, chills  or productive cough; patient does have problems with back pain. All other systems reviewed and are negative.   Recent Labs: 07/14/2018: B Natriuretic Peptide 255.2; TSH 1.497 07/17/2018: ALT 44; BUN 12; Creatinine, Ser 1.02; Hemoglobin 16.5; Magnesium 1.9; Platelets 169; Potassium 3.7; Sodium 138   Recent Lipid Panel Lab Results  Component Value Date/Time   CHOL 155 07/15/2018 02:47 AM   TRIG 95 07/15/2018 02:47 AM   HDL 32 (L) 07/15/2018 02:47 AM  CHOLHDL 4.8 07/15/2018 02:47 AM   LDLCALC 104 (H) 07/15/2018 02:47 AM    Wt Readings from Last 3 Encounters:  11/02/18 (!) 400 lb (181.4 kg)  08/03/18 (!) 390 lb (176.9 kg)  07/29/18 (!) 398 lb (180.5 kg)     Objective:    Vital Signs:  Ht 6' (1.829 m)    Wt (!) 400 lb (181.4 kg)    BMI 54.25 kg/m    VITAL SIGNS:  reviewed NAD Answers questions appropriately Normal affect Remainder of physical examination not performed (telehealth visit; coronavirus  pandemic)  ASSESSMENT & PLAN:    1. Chronic diastolic congestive heart failure-patient appears to be doing well symptomatically.  He does occasionally have bilateral lower extremity edema by his report.  Continue present dose of Lasix, fluid restriction and low-sodium diet.  Add Spironolactone 25 mg daily.  Check potassium and renal function in 1 week. 2. Hypertension-blood pressure remains elevated.  Increase losartan to 100 mg daily and add Spironolactone 25 mg daily.  Check potassium and renal function in 1 week.  May need to consider work-up for causes of secondary hypertension if blood pressure continues to be difficult to control. 3. chest pain-patient has had occasional chest pain since his catheterization in 2011 which showed normal coronary arteries.  Recent nuclear study showed no ischemia.  No symptoms since last office evaluation.  We will not pursue further evaluation at this point. 4. Hyperlipidemia-continue diet. 5. Morbid obesity-we discussed the importance of weight loss, diet and exercise. 6. Obstructive sleep apnea-continue CPAP.  COVID-19 Education: The importance of social distancing was discussed today.  Time:   Today, I have spent 18 minutes with the patient with telehealth technology discussing the above problems.     Medication Adjustments/Labs and Tests Ordered: Current medicines are reviewed at length with the patient today.  Concerns regarding medicines are outlined above.   Tests Ordered: No orders of the defined types were placed in this encounter.   Medication Changes: No orders of the defined types were placed in this encounter.   Follow Up:  In Person in 3 month(s)  Signed, Kirk Ruths, MD  11/02/2018 10:20 AM    [name]

## 2018-11-02 ENCOUNTER — Encounter: Payer: Self-pay | Admitting: Cardiology

## 2018-11-02 ENCOUNTER — Telehealth (INDEPENDENT_AMBULATORY_CARE_PROVIDER_SITE_OTHER): Payer: 59 | Admitting: Cardiology

## 2018-11-02 ENCOUNTER — Ambulatory Visit
Admission: RE | Admit: 2018-11-02 | Discharge: 2018-11-02 | Disposition: A | Discharge status: Home / Self Care | Payer: 59 | Source: Ambulatory Visit | Attending: Family Medicine | Admitting: Family Medicine

## 2018-11-02 ENCOUNTER — Other Ambulatory Visit: Payer: Self-pay

## 2018-11-02 VITALS — Ht 72.0 in | Wt >= 6400 oz

## 2018-11-02 DIAGNOSIS — I5032 Chronic diastolic (congestive) heart failure: Secondary | ICD-10-CM | POA: Diagnosis not present

## 2018-11-02 DIAGNOSIS — R072 Precordial pain: Secondary | ICD-10-CM

## 2018-11-02 DIAGNOSIS — I10 Essential (primary) hypertension: Secondary | ICD-10-CM

## 2018-11-02 DIAGNOSIS — R109 Unspecified abdominal pain: Secondary | ICD-10-CM

## 2018-11-02 MED ORDER — SPIRONOLACTONE 25 MG PO TABS: 25.0000 mg | ORAL_TABLET | Freq: Every day | ORAL | 3 refills | Status: DC

## 2018-11-02 MED ORDER — LOSARTAN POTASSIUM 100 MG PO TABS: 100.0000 mg | ORAL_TABLET | Freq: Every day | ORAL | 3 refills | Status: DC

## 2018-11-02 NOTE — Patient Instructions (Signed)
Medication Instructions:  Increase Losartan to 100 mg daily. Start Spironolactone 25 mg daily.  If you need a refill on your cardiac medications before your next appointment, please call your pharmacy.   Lab work: BMET in 1 week. If you have labs (blood work) drawn today and your tests are completely normal, you will receive your results only by: Marland Kitchen MyChart Message (if you have MyChart) OR . A paper copy in the mail If you have any lab test that is abnormal or we need to change your treatment, we will call you to review the results.  Follow-Up: At [facility], you and your health needs are our priority.  As part of our continuing mission to provide you with exceptional heart care, we have created designated Provider Care Teams.  These Care Teams include your primary Cardiologist (physician) and Advanced Practice Providers (APPs -  Physician Assistants and Nurse Practitioners) who all work together to provide you with the care you need, when you need it. You will need a follow up appointment in 3 months.  You may see Kirk Ruths, MD or one of the following Advanced Practice Providers on your designated Care Team:   Kerin Ransom, PA-C Roby Lofts, [place] . Sande Rives, PA-C

## 2018-11-09 ENCOUNTER — Encounter: Payer: Self-pay | Admitting: Physical Therapy

## 2018-11-09 ENCOUNTER — Other Ambulatory Visit: Payer: Self-pay

## 2018-11-09 ENCOUNTER — Ambulatory Visit: Payer: 59 | Attending: Family Medicine | Admitting: Physical Therapy

## 2018-11-09 DIAGNOSIS — M6281 Muscle weakness (generalized): Secondary | ICD-10-CM | POA: Diagnosis present

## 2018-11-09 DIAGNOSIS — R293 Abnormal posture: Secondary | ICD-10-CM | POA: Insufficient documentation

## 2018-11-09 DIAGNOSIS — G8929 Other chronic pain: Secondary | ICD-10-CM | POA: Insufficient documentation

## 2018-11-09 DIAGNOSIS — M545 Low back pain: Secondary | ICD-10-CM | POA: Diagnosis present

## 2018-11-09 NOTE — Patient Instructions (Signed)
Access Code: [email or web address]  URL: [email or web address]  Date: 11/09/2018  Prepared by: Venetia Night Beuhring   Exercises  Seated Hamstring Stretch - 2 reps - 1 sets - 30 hold - 1x daily - 7x weekly  Supine Piriformis Stretch with Foot on Ground - 2 reps - 1 sets - 30 hold - 1x daily - 7x weekly  Hooklying Single Knee to Chest Stretch - 5 reps - 1 sets - 10 hold - 1x daily - 7x weekly  Supine Posterior Pelvic Tilt - 20 reps - 1 sets - 5 hold - 1x daily - 7x weekly  Sidelying Transversus Abdominis Bracing - 10 reps - 1 sets - 10 hold - 1x daily - 7x weekly  Standing Sidebends - 20 reps - 1 sets - 1x daily - 7x weekly

## 2018-11-09 NOTE — Therapy (Signed)
[facility] [address] [place], [place], [address] Phone: [phone]   Fax:  [phone]  Physical Therapy Evaluation  Patient Details  Name: Justin Perez MRN: [id number] Date of Birth: [birth year] Referring Provider (PT): London Pepper, MD   Encounter Date: 11/09/2018  PT End of Session - 11/09/18 1729    Visit Number  1    Number of Visits  20    Date for PT Re-Evaluation  01/04/19    Authorization Type  UHC    Authorization - Visit Number  1    Authorization - Number of Visits  20    PT Start Time  7867    PT Stop Time  1710    PT Time Calculation (min)  50 min    Activity Tolerance  Patient tolerated treatment well;No increased pain    Behavior During Therapy  WFL for tasks assessed/performed       Past Medical History:  Diagnosis Date  . Hypertension   . Morbid obesity with BMI of 50.0-59.9, adult ([place])   . OSA on CPAP     Past Surgical History:  Procedure Laterality Date  . biceps surgery    . TONSILLECTOMY    . URETHRA SURGERY      There were no vitals filed for this visit.   Subjective Assessment - 11/09/18 1622    Subjective  Initial LBP onset in Aug 2020 when lifting mini-fridge moving daughter into school.  Has worsened since then.  Walking makes it worse, sometimes has numbness in Rt low back.  Maybe some improvement with stairwell walking.    Pertinent History  morbid obesity, HTN, CPAP use    Limitations  House hold activities;Standing;Walking    How long can you sit comfortably?  no limit    How long can you stand comfortably?  no limit    How long can you walk comfortably?  1/8 mile    Diagnostic tests  CT abdomen/pelvis 11/02/18, a degree of spinal stenosis at L4-5 due to disc    Patient Stated Goals  loosen back, be able to stand/walk longer with less pain, walking program for heart health/fluid management    Currently in Pain?  Yes    Pain Score  6    when walking   Pain  Location  Back    Pain Orientation  Left;Lower;Mid    Pain Descriptors / Indicators  Numbness;Tightness;Burning;Aching    Pain Type  Chronic pain    Pain Radiating Towards  upper glute on Lt    Pain Onset  More than a month ago    Pain Frequency  Intermittent    Aggravating Factors   walking, standing    Pain Relieving Factors  sitting, 1085m Tylenol    Effect of Pain on Daily Activities  walking for exericse, work (Engineer, materials         [facility] - 11/09/18 0001      Assessment   Medical Diagnosis  M54.5 (ICD-10-CM) - Low back pain    Referring Provider (PT)  MLondon Pepper MD    Onset Date/Surgical Date  --   Aug 2020   Hand Dominance  Right    Next MD Visit  no    Prior Therapy  no      Precautions   Precautions  None      Restrictions   Weight Bearing Restrictions  No      Balance Screen   Has the patient fallen  in the past 6 months  No      [place] residence    Living Arrangements  Spouse/significant other;Children    [place] to enter    Entrance Stairs-Number of Steps  [place]  One level      Prior Function   Level of Independence  Independent    Vocation  Full time employment    Copy    Leisure  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"      Cognition   Overall Cognitive Status  Within [employer] copywriter for tasks assessed      Observation/Other Assessments   Focus on Therapeutic Outcomes (FOTO)   47   goal 33     Posture/Postural Control   Posture/Postural Control  Postural limitations    Postural Limitations  Rounded Shoulders;Decreased lumbar lordosis;Increased thoracic kyphosis    Posture Comments  stands in trunk extension, sternum posterior to pubic bone      ROM / Strength   AROM / PROM / Strength  AROM;Strength      AROM   Overall AROM Comments  trunk ROM WFL with abdominal soft tissue limits  for trunk forward bending, bil hips limited ER, flexion, abduction, extension 40%      Strength   Overall Strength Comments  5/5 bil LEs      Flexibility   Soft Tissue Assessment /Muscle Length  yes    Hamstrings  limited 30% bil, neural tension + on Lt    Piriformis  limited 50% bil      Palpation   Palpation comment  deep multifidus L4/5 on Lt>Rt      Special Tests    Special Tests  Lumbar    Lumbar Tests  Straight Leg Raise      Straight Leg Raise   Findings  Positive    Side   Left      Transfers   Five time sit to stand comments   able to stand without UEs      Ambulation/Gait   Ambulation/Gait  Yes    Gait Pattern  Step-through pattern;Decreased arm swing - left;Decreased arm swing - right;Wide base of support;Decreased trunk rotation    Gait Comments  walks with pelvis anterior to trunk, in lumbar extension to offset abdominal weight      Balance   Balance Assessed  Yes      High Level Balance   High Level Balance Comments  unable to balance in SLS bil > 1 sec                Objective measurements completed on examination: See above findings.              PT Education - 11/09/18 1721    Education Details  Access Code: [email or web address]    Person(s) Educated  Patient    Methods  Explanation;Demonstration;Verbal cues;Handout    Comprehension  Verbalized understanding;Returned demonstration       PT Short Term Goals - 11/09/18 1743      PT SHORT TERM GOAL #1   Title  Pt will be ind in initial HEP    Time  3    Period  Weeks    Status  New    Target Date  11/30/18      PT SHORT TERM GOAL #2   Title  Pt will demo proper standing  posture alignment and use of transversus abdominus in static standing and walking to reduce overload and support low lumbar spine.    Time  3    Period  Weeks    Status  New    Target Date  11/30/18      PT SHORT TERM GOAL #3   Title  Pt will report reduced pain throughout work day by at least 20%    Time  4     Period  Weeks    Status  New    Target Date  12/07/18        PT Long Term Goals - 11/09/18 1745      PT LONG TERM GOAL #1   Title  Pt will be ind in advanced HEP for both land-based and aquatic setting.    Time  8    Period  Weeks    Status  New    Target Date  01/04/19      PT LONG TERM GOAL #2   Title  Pt will be able to walk for exerise for at least 20 min >/= 3 times/week with pain not to exceed 3/10 for improved cardiovascular health and weight loss.    Time  8    Period  Weeks    Status  New    Target Date  01/04/19      PT LONG TERM GOAL #3   Title  Pt will achieve bil hip ROM and LE flexibility to [facility] to reduce pain and strain in lumbar spine.    Time  8    Period  Weeks    Status  New    Target Date  01/04/19      PT LONG TERM GOAL #4   Title  Pt will reduce FOTO score to </= 33% to demo less limitation.    Time  8    Period  Weeks    Status  New    Target Date  01/04/19      PT LONG TERM GOAL #5   Title  Pt will report at least 70% reduction in low back pain at end of a work day.    Time  8    Period  Weeks    Status  New    Target Date  01/04/19             Plan - 11/09/18 1730    Clinical Impression Statement  Pt is a pleasant 43yo with complex medical history with acute onset of back pain approx 1 month ago when lifting a mini-fridge.  Pain is worse with walking and better with sitting.  Pain is located in central low back and spreads to Lt>Rt low back/upper gluteals.  Occassional numbness is experienced in Lt upper gluteals.  There is no leg pain although Pt noted some burning in plantar aspect of Lt foot.  Pt has 5/5 strength throughout LEs and poor deep core awareness/strength.  Trunk ROM is WFL, although flexion is limited due to morbid obesity (400lb).  Pt has signif limitation in LE flexibiity and hip ROM in all planes.  He has + SLR on Lt at 45 degrees.  Postural and gait abnormalities are present with overloading of low lumbar region due to  trunk extension to offset anterior abdominal weight.  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  He has been encouraged to walk for heart health and weight loss and has tried but is only able to walk 1/8 mile  at a time due to pain.  He reported much relief end of evaluation with instruction in HEP and Pt education on postural alignment in standing.  He will benefit from a combo of land and aquatic PT to address his deficits, reduce his pain, and increase his overall mobility, endurance, posture and flexibility.    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   Comorbidities  morbid obesity (400lb), CHF, HTN, obstructive sleep apnea on CPAP    Examination-Activity Limitations  Locomotion Level;Stand;Lift;Sleep;Bend;Squat;Stairs    Examination-Participation Restrictions  Cleaning;Shop;Laundry;Yard Work;Community Activity    Stability/Clinical Decision Making  Evolving/Moderate complexity    Clinical Decision Making  Moderate    Rehab Potential  Good    PT Frequency  2x / week    PT Duration  8 weeks    PT Treatment/Interventions  ADLs/Self Care Home Management;Aquatic Therapy;Cryotherapy;Electrical Stimulation;Iontophoresis 35m/ml Dexamethasone;Moist Heat;Traction;Functional mobility training;Therapeutic exercise;Therapeutic activities;Balance training;Neuromuscular re-education;Patient/family education;Manual techniques;Passive range of motion;Dry needling;Taping;Spinal Manipulations;Joint Manipulations    PT Next Visit Plan  f/u on HEP, P/ROM bil hips, Lt neural flossing supine (add to HEP if good response), standing posture alignment with TA, trial hip flexor stretch on steps avoiding lumbar ext, cardio endurance    PT Home Exercise Plan  Access Code: [email or web address]   Recommended Other Services  land and aquatic PT    Consulted and Agree with Plan of  Care  Patient       Patient will benefit from skilled therapeutic intervention in order to improve the following deficits and impairments:  Abnormal gait, Decreased range of motion, Difficulty walking, Increased fascial restricitons, Cardiopulmonary status limiting activity, Decreased endurance, Obesity, Increased muscle spasms, Decreased activity tolerance, Pain, Decreased balance, Hypomobility, Impaired flexibility, Improper body mechanics, Decreased mobility, Decreased strength, Postural dysfunction  Visit Diagnosis: Chronic bilateral low back pain without sciatica - Plan: PT plan of care cert/re-cert  Abnormal posture - Plan: PT plan of care cert/re-cert  Muscle weakness (generalized) - Plan: PT plan of care cert/re-cert     Problem List Patient Active Problem List   Diagnosis Date Noted  . Accelerated hypertension   . Acute pulmonary edema (HCC)   . Acute respiratory failure with hypoxia ([place] 07/14/2018  . Chest pain in adult 07/14/2018  . Suspected Covid-19 Virus Infection 07/14/2018  . Essential hypertension 07/14/2018  . Elevated troponin 07/14/2018  . Morbid obesity with BMI of 50.0-59.9, adult ([place] 07/14/2018  . OSA on CPAP 07/14/2018  . Hyperglycemia 07/14/2018    JBaruch Merl PT 11/09/18 5:57 PM   [facility] [address] [place] [address]Phone: [phone]  Fax:  [phone] Name: Justin MEASMRN: [id number] of Birth: [birth year]

## 2018-11-11 ENCOUNTER — Other Ambulatory Visit: Payer: Self-pay

## 2018-11-11 ENCOUNTER — Encounter: Payer: Self-pay | Admitting: Physical Therapy

## 2018-11-11 ENCOUNTER — Ambulatory Visit: Payer: 59 | Admitting: Physical Therapy

## 2018-11-11 DIAGNOSIS — G8929 Other chronic pain: Secondary | ICD-10-CM

## 2018-11-11 DIAGNOSIS — M545 Low back pain: Secondary | ICD-10-CM | POA: Diagnosis not present

## 2018-11-11 DIAGNOSIS — M6281 Muscle weakness (generalized): Secondary | ICD-10-CM

## 2018-11-11 DIAGNOSIS — R293 Abnormal posture: Secondary | ICD-10-CM

## 2018-11-11 NOTE — Therapy (Addendum)
[facility] [address] [place], [place], [address] Phone: [phone]   Fax:  [phone]  Physical Therapy Treatment  Patient Details  Name: Justin Perez MRN: [id number] Date of Birth: [birth year] Referring Provider (PT): London Pepper, MD   Encounter Date: 11/11/2018  PT End of Session - 11/11/18 1618    Visit Number  2    Date for PT Re-Evaluation  01/04/19    Authorization Type  UHC    PT Start Time  1618    PT Stop Time  8875    PT Time Calculation (min)  47 min    Activity Tolerance  Patient tolerated treatment well;No increased pain    Behavior During Therapy  WFL for tasks assessed/performed       Past Medical History:  Diagnosis Date  . Hypertension   . Morbid obesity with BMI of 50.0-59.9, adult ([place])   . OSA on CPAP     Past Surgical History:  Procedure Laterality Date  . biceps surgery    . TONSILLECTOMY    . URETHRA SURGERY      There were no vitals filed for this visit.  Subjective Assessment - 11/11/18 1618    Subjective  Busy day today doing a lot of walking so my pain is up.  I've had numbness in Lt low back and burning in Lt foot.    Pertinent History  morbid obesity, HTN, CPAP use    Limitations  House hold activities;Standing;Walking    How long can you sit comfortably?  no limit    How long can you stand comfortably?  no limit    How long can you walk comfortably?  1/8 mile    Diagnostic tests  CT abdomen/pelvis 11/02/18, a degree of spinal stenosis at L4-5 due to disc    Patient Stated Goals  loosen back, be able to stand/walk longer with less pain, walking program for heart health/fluid management    Currently in Pain?  Yes    Pain Score  6     Pain Location  Back    Pain Orientation  Left;Lower;Mid    Pain Descriptors / Indicators  Burning;Numbness;Aching;Tightness    Pain Type  Chronic pain    Pain Onset  More than a month ago    Pain Frequency  Intermittent     Aggravating Factors   walking, standing    Pain Relieving Factors  sitting, 1041m Tylenol    Effect of Pain on Daily Activities  walking for exercise, work (Engineer, materials                       ONorth[facility] Clinic LLCAdult PT Treatment/Exercise - 11/11/18 0001      Exercises   Exercises  Lumbar;Knee/Hip;Shoulder      Lumbar Exercises: Stretches   Active Hamstring Stretch  Left;Right;1 rep;20 seconds      Lumbar Exercises: Aerobic   Nustep  L4, seat 13 x 8', PT present to discuss progress      Lumbar Exercises: Machines for Strengthening   Other Lumbar Machine Exercise  25# bil shoulder ext hands to hips in stagger stance with TrA cueing 5x bil stagger stance posutre      Lumbar Exercises: Standing   Shoulder Extension  Strengthening;Power Tower;5 reps;Both    Shoulder Extension Limitations  25#, in stagger stance, soft knees, hips slightly flexed, hands to hips for TrA recruitment      Lumbar Exercises: Seated   Other Seated  Lumbar Exercises  blue ball rollouts 5x5 sec straight, Rt, Lt       Lumbar Exercises: Sidelying   Clam  Both;10 reps    Clam Limitations  with TrA cueing      Knee/Hip Exercises: Seated   Sit to Sand  2 sets;5 reps   on pad, hands on knees, PT cued neutral posture on standing     Manual Therapy   Manual Therapy  Passive ROM;Neural Stretch    Passive ROM  bil hip P/ROM ER, flexion knee bent, passive piriformis stretch     Neural Stretch  supine 90/90 small range oscillations Lt only, with care taken not to overstretch             PT Education - 11/11/18 1717    Education Details  Access Code: [email or web address]    Person(s) Educated  Patient    Methods  Explanation;Demonstration;Verbal cues;Handout    Comprehension  Verbalized understanding;Returned demonstration       PT Short Term Goals - 11/09/18 1743      PT SHORT TERM GOAL #1   Title  Pt will be ind in initial HEP    Time  3    Period  Weeks    Status  New    Target Date  11/30/18      PT  SHORT TERM GOAL #2   Title  Pt will demo proper standing posture alignment and use of transversus abdominus in static standing and walking to reduce overload and support low lumbar spine.    Time  3    Period  Weeks    Status  New    Target Date  11/30/18      PT SHORT TERM GOAL #3   Title  Pt will report reduced pain throughout work day by at least 20%    Time  4    Period  Weeks    Status  New    Target Date  12/07/18        PT Long Term Goals - 11/09/18 1745      PT LONG TERM GOAL #1   Title  Pt will be ind in advanced HEP for both land-based and aquatic setting.    Time  8    Period  Weeks    Status  New    Target Date  01/04/19      PT LONG TERM GOAL #2   Title  Pt will be able to walk for exerise for at least 20 min >/= 3 times/week with pain not to exceed 3/10 for improved cardiovascular health and weight loss.    Time  8    Period  Weeks    Status  New    Target Date  01/04/19      PT LONG TERM GOAL #3   Title  Pt will achieve bil hip ROM and LE flexibility to [facility] to reduce pain and strain in lumbar spine.    Time  8    Period  Weeks    Status  New    Target Date  01/04/19      PT LONG TERM GOAL #4   Title  Pt will reduce FOTO score to </= 33% to demo less limitation.    Time  8    Period  Weeks    Status  New    Target Date  01/04/19      PT LONG TERM GOAL #5   Title  Pt will report at least  70% reduction in low back pain at end of a work day.    Time  8    Period  Weeks    Status  New    Target Date  01/04/19            Plan - 11/11/18 1719    Clinical Impression Statement  Pt returns for first follow up session since evaluation.  He rated pain as 6/10 on arrival given he was at the end of a work day with a lot of standing/walking.  He has good response to targeted hip ROM and flexibility.  PT updated HEP to include beginning core progression, sit to stand with emphasis on finding neutral standing posture (vs overextension) and seated ball  rollouts for lumbar stretching.  Pt will continue to benefit from skilled PT along current POC.  Pain was more localized to lumbar region and rated 1/10 as he left today.    Comorbidities  morbid obesity (400lb), CHF, HTN, obstructive sleep apnea on CPAP    PT Frequency  2x / week    PT Duration  8 weeks    PT Treatment/Interventions  ADLs/Self Care Home Management;Aquatic Therapy;Cryotherapy;Electrical Stimulation;Iontophoresis 26m/ml Dexamethasone;Moist Heat;Traction;Functional mobility training;Therapeutic exercise;Therapeutic activities;Balance training;Neuromuscular re-education;Patient/family education;Manual techniques;Passive range of motion;Dry needling;Taping;Spinal Manipulations;Joint Manipulations    PT Next Visit Plan  f/u on HEP, P/ROM bil hips, core progression, standing posture alignment with TA, trial hip flexor stretch on steps avoiding lumbar ext, trial leg press cardio endurance    PT Home Exercise Plan  Access Code: [email or web address]   Recommended Other Services  land and aquatic PT    Consulted and Agree with Plan of Care  Patient       Patient will benefit from skilled therapeutic intervention in order to improve the following deficits and impairments:     Visit Diagnosis: Chronic bilateral low back pain without sciatica  Abnormal posture  Muscle weakness (generalized)     Problem List Patient Active Problem List   Diagnosis Date Noted  . Accelerated hypertension   . Acute pulmonary edema (HCC)   . Acute respiratory failure with hypoxia ([place] 07/14/2018  . Chest pain in adult 07/14/2018  . Suspected Covid-19 Virus Infection 07/14/2018  . Essential hypertension 07/14/2018  . Elevated troponin 07/14/2018  . Morbid obesity with BMI of 50.0-59.9, adult ([place] 07/14/2018  . OSA on CPAP 07/14/2018  . Hyperglycemia 07/14/2018    JBaruch Merl PT 11/11/18 5:26 PM   [facility] [address] [place] [address]Phone: [phone]  Fax:  [phone] Name: WRAYDEL HOSICKMRN: [id number] of Birth: [birth year] *addendum to include information regarding aquatic therapy program. This was provided to the pt on 11/16/18 via the email address he provided. We are so excited to be back in the pool for therapy and can't wait to see you in the water!! Having said that, we also want to make sure that we keep you, your family member, and everyone one else safe.  We have been in touch with our national aquatic association as well as the CDC to develop safe guidelines for aquatic therapy.  First, we want to assure you that the water is one of the safest places to be right now - chlorine and bromine kill the virus and the CDC states the virus cannot be transmitted in a pool; that's great news for [place] Below are specific guidelines from the CDC that we will ask you  and your caregiver to follow when coming to the [facility] for therapy. 1. Please shower AT HOME and come in your bathing suit and cover up to the pool. 2. Plan to leave in your suit and cover up and change at home - this decreases or eliminates time you will spend in the locker room. 3. Follow the Aquatic Center's guidelines to use bathroom facilities as needed (they will educate your caregiver on these guidelines as they enter the Center). 4. Your caregiver will be checked in and screened by the [facility] (there will be a table outside at the front door).  5. We will meet YOU at the door and escort you into the pool directly - you will not need to be screened by the Center.  We will screen as you enter with [place].  Your caregiver will join [place] pool side after check in. 6. Masks:  your caregiver must wear a mask at all times. The CDC recommends that patients and therapists wear their masks until we enter the water and then put them back on as we leave the water. PLEASE BRING A PLASTIC BAG TO STORE YOUR MASK IN WHILE  WE ARE IN THE POOL.   Additional safety measures: 1. The [facility] will be practicing social distancing, wearing masks and implementing a stringent cleaning program.  2. We will only be using hard surface equipment and we will be cleaning it between all patients. 3. We will be cleaning hand rails used to enter the pool and chairs that your caregiver might use. 4. We as employees of [employer] must complete a screening every day we work prior to our shift. 5. We are only offering aquatic therapy to patients who have been determined to be at low risk for the virus - we are happy to share that screen with you if you are interested.  We are excited to be able to offer aquatic therapy again in addition to services at our outpatient clinic - thank you for the opportunity to serve you!  Myrene Galas, PTA [phone] Leone Payor, PT  4:26 PM,11/16/18 [place], [place] at [place]

## 2018-11-11 NOTE — Patient Instructions (Signed)
Access Code: [email or web address]  URL: [email or web address]  Date: 11/11/2018  Prepared by: Venetia Night Kaitelyn Jamison   Exercises  Seated Hamstring Stretch - 2 reps - 1 sets - 30 hold - 1x daily - 7x weekly  Supine Piriformis Stretch with Foot on Ground - 2 reps - 1 sets - 30 hold - 1x daily - 7x weekly  Hooklying Single Knee to Chest Stretch - 5 reps - 1 sets - 10 hold - 1x daily - 7x weekly  Supine Posterior Pelvic Tilt - 20 reps - 1 sets - 5 hold - 1x daily - 7x weekly  Sidelying Transversus Abdominis Bracing - 10 reps - 1 sets - 10 hold - 1x daily - 7x weekly  Standing Sidebends - 20 reps - 1 sets - 1x daily - 7x weekly  Seated Flexion Stretch with Swiss Ball - 5 reps - 1 sets - 5 hold - 1x daily - 7x weekly  Seated Thoracic Flexion and Rotation with Swiss Ball - 5 reps - 1 sets - 5 hold - 1x daily - 7x weekly  Clamshell - 15 reps - 2 sets - 1x daily - 7x weekly  Sit to Stand with Hands on Knees - 5 reps - 3 sets - 1x daily - 7x weekly  Shoulder extension with resistance - Neutral - 10 reps - 2 sets - 5 hold - 1x daily - 7x weekly

## 2018-11-16 ENCOUNTER — Ambulatory Visit: Payer: 59 | Admitting: Physical Therapy

## 2018-11-16 ENCOUNTER — Telehealth: Payer: Self-pay | Admitting: Physical Therapy

## 2018-11-16 NOTE — Telephone Encounter (Signed)
PT spoke with pt regarding information for aquatic program that he is planning to start of Friday. He provided his e-mail and was agreeable to the info being sent via e-mail.    4:21 PM,11/16/18 Mechell Girgis PT, [place] at [place]

## 2018-11-18 ENCOUNTER — Encounter: Payer: 59 | Admitting: Physical Therapy

## 2018-11-19 ENCOUNTER — Other Ambulatory Visit: Payer: Self-pay

## 2018-11-19 ENCOUNTER — Ambulatory Visit: Payer: 59 | Attending: Family Medicine | Admitting: Physical Therapy

## 2018-11-19 ENCOUNTER — Encounter: Payer: Self-pay | Admitting: Physical Therapy

## 2018-11-19 DIAGNOSIS — M6281 Muscle weakness (generalized): Secondary | ICD-10-CM | POA: Diagnosis present

## 2018-11-19 DIAGNOSIS — G8929 Other chronic pain: Secondary | ICD-10-CM | POA: Diagnosis present

## 2018-11-19 DIAGNOSIS — M545 Low back pain: Secondary | ICD-10-CM | POA: Insufficient documentation

## 2018-11-19 DIAGNOSIS — R293 Abnormal posture: Secondary | ICD-10-CM | POA: Insufficient documentation

## 2018-11-19 NOTE — Therapy (Signed)
[facility] [address], [place], [place], [address] Phone: [phone]   Fax:  [phone]  Physical Therapy Treatment  Patient Details  Name: Justin Perez MRN: [id number] Date of Birth: 1975/12/30 Referring Provider (PT): London Pepper, MD   Encounter Date: 11/19/2018  PT End of Session - 11/19/18 1639    Visit Number  3    Number of Visits  20    Date for PT Re-Evaluation  01/04/19    Authorization Type  U[employer]    Authorization - Visit Number  2    Authorization - Number of Visits  20    PT Start Time  1430    PT Stop Time  1510    PT Time Calculation (min)  40 min    Activity Tolerance  Patient tolerated treatment well    Behavior During Therapy  [facility] for tasks assessed/performed       Past Medical History:  Diagnosis Date  . Hypertension   . Morbid obesity with BMI of 50.0-59.9, adult ([place])   . OSA on CPAP     Past Surgical History:  Procedure Laterality Date  . biceps surgery    . TONSILLECTOMY    . URETHRA SURGERY      There were no vitals filed for this visit.  Subjective Assessment - 11/19/18 1639    Subjective  Having a good day, pain is low. I have not worked today.    Pertinent History  morbid obesity, HTN, CPAP use    Currently in Pain?  Yes    Pain Score  3     Pain Location  Back    Pain Orientation  Left;Lower    Pain Descriptors / Indicators  Dull;Sore    Aggravating Factors   Walking, standing    Pain Relieving Factors  Sitting    Multiple Pain Sites  No       Treatment: Aquatic therapy Water temperature 86.7 degrees F Pt entered and exited via long ramp using bil hand rails.  Seated on bench with 50% submersion: Ankle, knee, hip AROM exercises with concurrent discussion of water principles , pain assessment, and what he would like to accomplish today in the pool.   Water walking in mid waist depth. Due to pt's height it it impossible to get him any deeper.  1 length of  pool in each direction.   Standing against wall in partial squat. Post pelvic tilt with small noodle core activation; 3 sec hold 5x Standing hip circumduction 10x bil, holding onto side of the pool. VC to not lean RT with trunk with LT circles.   Decompression float x 3 min using medium pool noodle Same position as above for QL/leg lengthener stretch. Multiple VC for pt to "get it."   Seated hamstring stretch bil 2x 20 sec. Vc to relax toes on LTLE when gastroc burned.  Same                           PT Short Term Goals - 11/09/18 1743      PT SHORT TERM GOAL #1   Title  Pt will be ind in initial HEP    Time  3    Period  Weeks    Status  New    Target Date  11/30/18      PT SHORT TERM GOAL #2   Title  Pt will demo proper standing posture alignment and use  of transversus abdominus in static standing and walking to reduce overload and support low lumbar spine.    Time  3    Period  Weeks    Status  New    Target Date  11/30/18      PT SHORT TERM GOAL #3   Title  Pt will report reduced pain throughout work day by at least 20%    Time  4    Period  Weeks    Status  New    Target Date  12/07/18        PT Long Term Goals - 11/09/18 1745      PT LONG TERM GOAL #1   Title  Pt will be ind in advanced HEP for both land-based and aquatic setting.    Time  8    Period  Weeks    Status  New    Target Date  01/04/19      PT LONG TERM GOAL #2   Title  Pt will be able to walk for exerise for at least 20 min >/= 3 times/week with pain not to exceed 3/10 for improved cardiovascular health and weight loss.    Time  8    Period  Weeks    Status  New    Target Date  01/04/19      PT LONG TERM GOAL #3   Title  Pt will achieve bil hip ROM and LE flexibility to [facility] to reduce pain and strain in lumbar spine.    Time  8    Period  Weeks    Status  New    Target Date  01/04/19      PT LONG TERM GOAL #4   Title  Pt will reduce FOTO score to </= 33% to demo  less limitation.    Time  8    Period  Weeks    Status  New    Target Date  01/04/19      PT LONG TERM GOAL #5   Title  Pt will report at least 70% reduction in low back pain at end of a work day.    Time  8    Period  Weeks    Status  New    Target Date  01/04/19            Plan - 11/19/18 1640    Clinical Impression Statement  Pt arrives for first aquatic session. He presents with low level LT lumbar pain that did not increase but maybe 1 point on the pain scale during the pool session. Pt was educated in principles of water exercise we will use including buoyancy and how to use the water to generate resistance. Pt verbally understood all principles. Pt demonstrates Lt hip tightness/immobility greater than RT but no pain. Pt reports "feeling some better " after his session.    Personal Factors and Comorbidities  Fitness;Comorbidity 1;Comorbidity 2;Profession;Comorbidity 3+    Comorbidities  morbid obesity (400lb), CHF, HTN, obstructive sleep apnea on CPAP    Examination-Activity Limitations  Locomotion Level;Stand;Lift;Sleep;Bend;Squat;Stairs    Examination-Participation Restrictions  Cleaning;Shop;Laundry;Yard Work;Community Activity    Stability/Clinical Decision Making  Evolving/Moderate complexity    Rehab Potential  Good    PT Frequency  2x / week    PT Duration  8 weeks    PT Treatment/Interventions  ADLs/Self Care Home Management;Aquatic Therapy;Cryotherapy;Electrical Stimulation;Iontophoresis 39m/ml Dexamethasone;Moist Heat;Traction;Functional mobility training;Therapeutic exercise;Therapeutic activities;Balance training;Neuromuscular re-education;Patient/family education;Manual techniques;Passive range of motion;Dry needling;Taping;Spinal Manipulations;Joint Manipulations  PT Next Visit Plan  f/u on HEP, P/ROM bil hips, core progression, standing posture alignment with TA, trial hip flexor stretch on steps avoiding lumbar ext, trial leg press cardio endurance    PT Home  Exercise Plan  Access Code: [email or web address]    Consulted and Agree with Plan of Care  Patient       Patient will benefit from skilled therapeutic intervention in order to improve the following deficits and impairments:  Abnormal gait, Decreased range of motion, Difficulty walking, Increased fascial restricitons, Cardiopulmonary status limiting activity, Decreased endurance, Obesity, Increased muscle spasms, Decreased activity tolerance, Pain, Decreased balance, Hypomobility, Impaired flexibility, Improper body mechanics, Decreased mobility, Decreased strength, Postural dysfunction  Visit Diagnosis: Chronic bilateral low back pain without sciatica  Abnormal posture  Muscle weakness (generalized)     Problem List Patient Active Problem List   Diagnosis Date Noted  . Accelerated hypertension   . Acute pulmonary edema (HCC)   . Acute respiratory failure with hypoxia ([place]) 07/14/2018  . Chest pain in adult 07/14/2018  . Suspected COVID-19 virus infection 07/14/2018  . Essential hypertension 07/14/2018  . Elevated troponin 07/14/2018  . Morbid obesity with BMI of 50.0-59.9, adult ([place]) 07/14/2018  . OSA on CPAP 07/14/2018  . Hyperglycemia 07/14/2018    Justin Perez, Justin Perez 11/19/2018, 4:45 PM  [facility] [address], [place], [place], [address] Phone: [phone]   Fax:  [phone]  Name: Justin Perez MRN: [id number] Date of Birth: December 19, 1975

## 2018-11-25 ENCOUNTER — Encounter: Payer: 59 | Admitting: Physical Therapy

## 2018-11-26 ENCOUNTER — Ambulatory Visit: Payer: 59 | Admitting: Physical Therapy

## 2018-11-26 ENCOUNTER — Encounter: Payer: Self-pay | Admitting: Physical Therapy

## 2018-11-26 ENCOUNTER — Other Ambulatory Visit: Payer: Self-pay

## 2018-11-26 DIAGNOSIS — G8929 Other chronic pain: Secondary | ICD-10-CM

## 2018-11-26 DIAGNOSIS — M545 Low back pain, unspecified: Secondary | ICD-10-CM

## 2018-11-26 DIAGNOSIS — M6281 Muscle weakness (generalized): Secondary | ICD-10-CM

## 2018-11-26 DIAGNOSIS — R293 Abnormal posture: Secondary | ICD-10-CM

## 2018-11-26 NOTE — Therapy (Addendum)
[facility] [address] [place], [place], [address] Phone: [phone]   Fax:  [phone]  Physical Therapy Treatment  Patient Details  Name: Justin Perez MRN: [id number] Date of Birth: 11-26-1975 Referring Provider (PT): London Pepper, MD   Encounter Date: 11/26/2018  PT End of Session - 11/26/18 2057    Visit Number  4    Number of Visits  20    Date for PT Re-Evaluation  01/04/19    Authorization Type  UHC    Authorization - Visit Number  4    Authorization - Number of Visits  20    PT Start Time  3300    PT Stop Time  1510    PT Time Calculation (min)  50 min    Activity Tolerance  Patient tolerated treatment well;No increased pain    Behavior During Therapy  WFL for tasks assessed/performed       Past Medical History:  Diagnosis Date  . Hypertension   . Morbid obesity with BMI of 50.0-59.9, adult ([place])   . OSA on CPAP     Past Surgical History:  Procedure Laterality Date  . biceps surgery    . TONSILLECTOMY    . URETHRA SURGERY      There were no vitals filed for this visit.  Subjective Assessment - 11/26/18 2055    Subjective  I wasn't sure if I felt any better after the pool, but since last session my pain has stayed in low range.    Pertinent History  morbid obesity, HTN, CPAP use    Limitations  House hold activities;Standing;Walking    Patient Stated Goals  loosen back, be able to stand/walk longer with less pain, walking program for heart health/fluid management    Currently in Pain?  Yes    Pain Score  3     Pain Location  Back    Pain Orientation  Left;Lower    Pain Descriptors / Indicators  Dull;Sore    Aggravating Factors   Lifting heavy items    Pain Relieving Factors  Sitting    Multiple Pain Sites  No       Treatment: Aquatic session Water temp 86.7 degrees F Pt entered and exited pool via long ramp using bil hand rails.  Seated: 75% submersion with AROM of ankle,  knees, and hips with concurrent discussion of pain and treatment goals for today. Hamstring stretch bil 3x 20 sec  Standing: Water walking in waist depth with pt generating bigger current today to increase his work. 2 lengths of pool in each direction. Mini squat with back against the pool wall: Medium noodle press with TA contraction and post pelvic tilt. Hold 5 sec, 10x Hip circumduction bil 10x in each direction Hip abduction 10x bil, VC to not elevate his LT pelvis. QL stretch bil 3x 30 sec and LE float for opposite trunk stretch both with noodle behind pt for flotation bil 3x 20 sec 90 degrees angle stretch for LTQL at edge of pool 2x 20 sec, demo how pt can repeat on a job site. Bil gastroc stretch 3x 20 sec                           PT Short Term Goals - 11/26/18 2103      PT SHORT TERM GOAL #1   Title  Pt will be ind in initial HEP    Time  3  Period  Weeks    Status  Achieved    Target Date  11/30/18        PT Long Term Goals - 11/09/18 1745      PT LONG TERM GOAL #1   Title  Pt will be ind in advanced HEP for both land-based and aquatic setting.    Time  8    Period  Weeks    Status  New    Target Date  01/04/19      PT LONG TERM GOAL #2   Title  Pt will be able to walk for exerise for at least 20 min >/= 3 times/week with pain not to exceed 3/10 for improved cardiovascular health and weight loss.    Time  8    Period  Weeks    Status  New    Target Date  01/04/19      PT LONG TERM GOAL #3   Title  Pt will achieve bil hip ROM and LE flexibility to [facility] to reduce pain and strain in lumbar spine.    Time  8    Period  Weeks    Status  New    Target Date  01/04/19      PT LONG TERM GOAL #4   Title  Pt will reduce FOTO score to </= 33% to demo less limitation.    Time  8    Period  Weeks    Status  New    Target Date  01/04/19      PT LONG TERM GOAL #5   Title  Pt will report at least 70% reduction in low back pain at end of a work  day.    Time  8    Period  Weeks    Status  New    Target Date  01/04/19            Plan - 11/26/18 2058    Clinical Impression Statement  Pt arrives with reports that his pain has consistently been on the lower range, 3-4/10, all week versus the 6-7/10 level. He continues to be compliant with his HEP and did not have a land visit this week. Pt was able to increase his resisatnce with the water walking by generating a good current. Pt can abolish low back discomfort with a posterior pelvic tilt. Lt hip continues to be tighter as he has difficulties seperating back from hip motion.    Personal Factors and Comorbidities  Fitness;Comorbidity 1;Comorbidity 2;Profession;Comorbidity 3+    Comorbidities  morbid obesity (400lb), CHF, HTN, obstructive sleep apnea on CPAP    Examination-Activity Limitations  Locomotion Level;Stand;Lift;Sleep;Bend;Squat;Stairs    Examination-Participation Restrictions  Cleaning;Shop;Laundry;Yard Work;Community Activity    Stability/Clinical Decision Making  Evolving/Moderate complexity    Rehab Potential  Good    PT Frequency  2x / week    PT Duration  8 weeks    PT Treatment/Interventions  ADLs/Self Care Home Management;Aquatic Therapy;Cryotherapy;Electrical Stimulation;Iontophoresis 41m/ml Dexamethasone;Moist Heat;Traction;Functional mobility training;Therapeutic exercise;Therapeutic activities;Balance training;Neuromuscular re-education;Patient/family education;Manual techniques;Passive range of motion;Dry needling;Taping;Spinal Manipulations;Joint Manipulations    PT Next Visit Plan  Land PT next visit.    PT Home Exercise Plan  Access Code: [email or web address]   Consulted and Agree with Plan of Care  Patient       Patient will benefit from skilled therapeutic intervention in order to improve the following deficits and impairments:  Abnormal gait, Decreased range of motion, Difficulty walking, Increased fascial restricitons, Cardiopulmonary status limiting activity,  Decreased endurance,  Obesity, Increased muscle spasms, Decreased activity tolerance, Pain, Decreased balance, Hypomobility, Impaired flexibility, Improper body mechanics, Decreased mobility, Decreased strength, Postural dysfunction  Visit Diagnosis: Chronic bilateral low back pain without sciatica  Abnormal posture  Muscle weakness (generalized)     Problem List Patient Active Problem List   Diagnosis Date Noted  . Accelerated hypertension   . Acute pulmonary edema (HCC)   . Acute respiratory failure with hypoxia ([place]) 07/14/2018  . Chest pain in adult 07/14/2018  . Suspected COVID-19 virus infection 07/14/2018  . Essential hypertension 07/14/2018  . Elevated troponin 07/14/2018  . Morbid obesity with BMI of 50.0-59.9, adult ([place]) 07/14/2018  . OSA on CPAP 07/14/2018  . Hyperglycemia 07/14/2018    Justin Perez, PTA 11/26/2018, 9:05 PM   PHYSICAL THERAPY DISCHARGE SUMMARY  Visits from Start of Care: 4  Current functional level related to goals / functional outcomes: See above   Remaining deficits: See above.  Pt had to d/c PT due to working an out of town job M-F for foreseeable future.  He will seek a new PT Rx when he is back in town.   Education / Equipment: HEP Plan: Patient agrees to discharge.  Patient goals were not met. Patient is being discharged due to the patient's request.  ?????         Justin Perez, PT 12/28/18 4:37 PM   [facility] [address] [place], [place], [address] Phone: [phone]   Fax:  [phone]  Name: Justin Perez MRN: [id number] Date of Birth: 1975-12-05

## 2018-11-30 ENCOUNTER — Ambulatory Visit (INDEPENDENT_AMBULATORY_CARE_PROVIDER_SITE_OTHER): Payer: Self-pay | Admitting: Family Medicine

## 2018-11-30 ENCOUNTER — Ambulatory Visit: Payer: 59 | Admitting: Physical Therapy

## 2018-12-02 ENCOUNTER — Encounter: Payer: 59 | Admitting: Physical Therapy

## 2018-12-03 ENCOUNTER — Ambulatory Visit: Payer: 59 | Admitting: Physical Therapy

## 2018-12-07 ENCOUNTER — Telehealth: Payer: Self-pay | Admitting: Physical Therapy

## 2018-12-07 ENCOUNTER — Ambulatory Visit: Payer: 59 | Admitting: Physical Therapy

## 2018-12-07 ENCOUNTER — Encounter: Payer: Self-pay | Admitting: Physical Therapy

## 2018-12-07 NOTE — Telephone Encounter (Signed)
Mailbox was full, couldn't leave a voicemail.  Sent Pt a MyChart message about missed PT visit and asked him to call our office for any schedule changes he may need. Naasia Weilbacher, PT 12/07/18 4:51 PM

## 2018-12-09 ENCOUNTER — Encounter: Payer: Self-pay | Admitting: Physical Therapy

## 2018-12-10 ENCOUNTER — Encounter: Payer: 59 | Admitting: Physical Therapy

## 2018-12-10 ENCOUNTER — Ambulatory Visit: Payer: 59 | Admitting: Physical Therapy

## 2018-12-14 ENCOUNTER — Ambulatory Visit: Payer: 59 | Admitting: Physical Therapy

## 2018-12-14 ENCOUNTER — Ambulatory Visit (INDEPENDENT_AMBULATORY_CARE_PROVIDER_SITE_OTHER): Payer: Self-pay | Admitting: Family Medicine

## 2018-12-17 ENCOUNTER — Encounter: Payer: 59 | Admitting: Physical Therapy

## 2018-12-21 ENCOUNTER — Encounter: Payer: 59 | Admitting: Physical Therapy

## 2018-12-24 ENCOUNTER — Encounter: Payer: 59 | Admitting: Physical Therapy

## 2018-12-28 ENCOUNTER — Ambulatory Visit: Payer: 59 | Attending: Family Medicine | Admitting: Physical Therapy

## 2018-12-31 ENCOUNTER — Ambulatory Visit: Payer: 59 | Admitting: Physical Therapy

## 2019-01-04 ENCOUNTER — Ambulatory Visit: Payer: 59 | Admitting: Physical Therapy

## 2019-02-01 ENCOUNTER — Telehealth: Payer: 59 | Admitting: Cardiology

## 2019-04-20 ENCOUNTER — Other Ambulatory Visit: Payer: Self-pay

## 2019-04-20 ENCOUNTER — Encounter (HOSPITAL_COMMUNITY): Payer: Self-pay

## 2019-04-20 ENCOUNTER — Emergency Department (HOSPITAL_COMMUNITY): Payer: 59

## 2019-04-20 ENCOUNTER — Emergency Department (HOSPITAL_COMMUNITY)
Admission: EM | Admit: 2019-04-20 | Discharge: 2019-04-20 | Disposition: A | Discharge status: Home / Self Care | Payer: 59 | Attending: Emergency Medicine | Admitting: Emergency Medicine

## 2019-04-20 ENCOUNTER — Other Ambulatory Visit: Payer: Self-pay | Admitting: Internal Medicine

## 2019-04-20 DIAGNOSIS — H60502 Unspecified acute noninfective otitis externa, left ear: Secondary | ICD-10-CM | POA: Diagnosis not present

## 2019-04-20 DIAGNOSIS — I509 Heart failure, unspecified: Secondary | ICD-10-CM | POA: Diagnosis not present

## 2019-04-20 DIAGNOSIS — Z79899 Other long term (current) drug therapy: Secondary | ICD-10-CM | POA: Insufficient documentation

## 2019-04-20 DIAGNOSIS — U071 COVID-19: Secondary | ICD-10-CM | POA: Insufficient documentation

## 2019-04-20 DIAGNOSIS — I110 Hypertensive heart disease with heart failure: Secondary | ICD-10-CM | POA: Diagnosis not present

## 2019-04-20 DIAGNOSIS — H9202 Otalgia, left ear: Secondary | ICD-10-CM | POA: Diagnosis present

## 2019-04-20 HISTORY — DX: Heart failure, unspecified: I50.9

## 2019-04-20 LAB — CBC WITH DIFFERENTIAL/PLATELET
Abs Immature Granulocytes: 0.01 K/uL (ref 0.00–0.07)
Basophils Absolute: 0 K/uL (ref 0.0–0.1)
Basophils Relative: 0 %
Eosinophils Absolute: 0 K/uL (ref 0.0–0.5)
Eosinophils Relative: 0 %
HCT: 54.4 % — ABNORMAL HIGH (ref 39.0–52.0)
Hemoglobin: 18.1 g/dL — ABNORMAL HIGH (ref 13.0–17.0)
Immature Granulocytes: 0 %
Lymphocytes Relative: 27 %
Lymphs Abs: 0.9 K/uL (ref 0.7–4.0)
MCH: 30.7 pg (ref 26.0–34.0)
MCHC: 33.3 g/dL (ref 30.0–36.0)
MCV: 92.2 fL (ref 80.0–100.0)
Monocytes Absolute: 0.4 K/uL (ref 0.1–1.0)
Monocytes Relative: 10 %
Neutro Abs: 2.2 K/uL (ref 1.7–7.7)
Neutrophils Relative %: 63 %
Platelets: 135 K/uL — ABNORMAL LOW (ref 150–400)
RBC: 5.9 MIL/uL — ABNORMAL HIGH (ref 4.22–5.81)
RDW: 12.9 % (ref 11.5–15.5)
WBC: 3.5 K/uL — ABNORMAL LOW (ref 4.0–10.5)
nRBC: 0 % (ref 0.0–0.2)

## 2019-04-20 LAB — BASIC METABOLIC PANEL
Anion gap: 9 (ref 5–15)
BUN: 8 mg/dL (ref 6–20)
CO2: 22 mmol/L (ref 22–32)
Calcium: 8.8 mg/dL — ABNORMAL LOW (ref 8.9–10.3)
Chloride: 104 mmol/L (ref 98–111)
Creatinine, Ser: 0.98 mg/dL (ref 0.61–1.24)
GFR calc Af Amer: >60 mL/min (ref >60)
GFR calc non Af Amer: >60 mL/min (ref >60)
Glucose, Bld: 102 mg/dL — ABNORMAL HIGH (ref 70–99)
Potassium: 4.1 mmol/L (ref 3.5–5.1)
Sodium: 135 mmol/L (ref 135–145)

## 2019-04-20 LAB — LACTIC ACID, PLASMA: Lactic Acid, Venous: 0.9 mmol/L (ref 0.5–1.9)

## 2019-04-20 MED ORDER — ACETAMINOPHEN 325 MG PO TABS
650.0000 mg | ORAL_TABLET | Freq: Once | ORAL | Status: AC
  Administered 2019-04-20: 650 mg via ORAL

## 2019-04-20 MED ORDER — OFLOXACIN 0.3 % OT SOLN: 10.0000 [drp] | Freq: Every day | OTIC | 0 refills | Status: AC

## 2019-04-20 MED ORDER — IOHEXOL 300 MG/ML  SOLN
75.0000 mL | Freq: Once | INTRAMUSCULAR | Status: AC | PRN
  Administered 2019-04-20: 75 mL via INTRAVENOUS

## 2019-04-20 MED ORDER — SODIUM CHLORIDE 0.9 % IV SOLN
700.0000 mg | Freq: Once | INTRAVENOUS | Status: AC
  Administered 2019-04-21: 700 mg via INTRAVENOUS
  Filled 2019-04-20: qty 20

## 2019-04-20 MED ORDER — SODIUM CHLORIDE 0.9 % IV BOLUS
1000.0000 mL | Freq: Once | INTRAVENOUS | Status: AC
  Administered 2019-04-20: 1000 mL via INTRAVENOUS

## 2019-04-20 NOTE — Progress Notes (Signed)
  I connected by phone with Gust Rung on 04/20/2019 at 2:53 PM to discuss the potential use of an new treatment for mild to moderate COVID-19 viral infection in non-hospitalized patients.  This patient is a 44 y.o. male that meets the FDA criteria for Emergency Use Authorization of bamlanivimab or casirivimab\imdevimab.  Has a (+) direct SARS-CoV-2 viral test result  Has mild or moderate COVID-19   Is ? 44 years of age and weighs ? 40 kg  Is NOT hospitalized due to COVID-19  Is NOT requiring oxygen therapy or requiring an increase in baseline oxygen flow rate due to COVID-19  Is within 10 days of symptom onset  Has at least one of the high risk factor(s) for progression to severe COVID-19 and/or hospitalization as defined in EUA.  Specific high risk criteria : BMI >/= 35   I have spoken and communicated the following to the patient or parent/caregiver:  1. FDA has authorized the emergency use of bamlanivimab and casirivimab\imdevimab for the treatment of mild to moderate COVID-19 in adults and pediatric patients with positive results of direct SARS-CoV-2 viral testing who are 15 years of age and older weighing at least 40 kg, and who are at high risk for progressing to severe COVID-19 and/or hospitalization.  2. The significant known and potential risks and benefits of bamlanivimab and casirivimab\imdevimab, and the extent to which such potential risks and benefits are unknown.  3. Information on available alternative treatments and the risks and benefits of those alternatives, including clinical trials.  4. Patients treated with bamlanivimab and casirivimab\imdevimab should continue to self-isolate and use infection control measures (e.g., wear mask, isolate, social distance, avoid sharing personal items, clean and disinfect "high touch" surfaces, and frequent handwashing) according to CDC guidelines.   5. The patient or parent/caregiver has the option to accept or refuse  bamlanivimab or casirivimab\imdevimab .  After reviewing this information with the patient, The patient agreed to proceed with receiving the bamlanimivab infusion and will be provided a copy of the Fact sheet prior to receiving the infusion.   Infusion scheduled for 3/4 at 1030. This will be day 6 of symptoms.   Alan Ripper, NP-C [place]

## 2019-04-20 NOTE — Discharge Instructions (Signed)
Your CT scan was reassuring today. There were no signs of mastoiditis.  Please pick up prescription and take as prescribed. Continue self quarantining given your COVID 19 positive diagnosis.  The infusion center should call you to schedule an appointment  Follow up with your PCP regarding your ED visit today

## 2019-04-20 NOTE — ED Provider Notes (Signed)
[facility] EMERGENCY DEPARTMENT Provider Note   CSN: [id number] Arrival date & time: 04/20/19  1019     History Chief Complaint  Patient presents with  . Otalgia  . COVID+    Justin Perez is a 44 y.o. male with PMHx HTN, CHF, and OSA on CPAP who presents to the ED today with complaint of gradual onset, constant, achy, L ear pain x 4 days.  Patient also endorses sore throat, cough, body aches, fevers with T-max 102.9 yesterday.  He was tested positive for COVID-19 2 days ago.  He states he had a telemedicine visit with his physician regarding his COVID-19 status and mention his ear pain.  He states that the pain is mostly located to the posterior aspect behind the ear and was sent here for further evaluation to rule out mastoiditis.  Patient states that he has been having some difficulty hearing as well, denies any ear discharge.  Last took Tylenol last night around 11 PM.  Temp in the ED 100.0 Fahrenheit.   The history is provided by the patient and medical records.       Past Medical History:  Diagnosis Date  . CHF (congestive heart failure) ([place])   . Hypertension   . Morbid obesity with BMI of 50.0-59.9, adult ([place])   . OSA on CPAP     Patient Active Problem List   Diagnosis Date Noted  . Accelerated hypertension   . Acute pulmonary edema ([facility])   . Acute respiratory failure with hypoxia ([place]) 07/14/2018  . Chest pain in adult 07/14/2018  . Suspected COVID-19 virus infection 07/14/2018  . Essential hypertension 07/14/2018  . Elevated troponin 07/14/2018  . Morbid obesity with BMI of 50.0-59.9, adult ([place]) 07/14/2018  . OSA on CPAP 07/14/2018  . Hyperglycemia 07/14/2018    Past Surgical History:  Procedure Laterality Date  . biceps surgery    . TONSILLECTOMY    . URETHRA SURGERY         Family History  Problem Relation Age of Onset  . Heart failure Mother 35  . Diabetes Mother   . COPD Father     Social History   Tobacco Use  .  Smoking status: Never Smoker  . Smokeless tobacco: Never Used  Substance Use Topics  . Alcohol use: Yes    Comment: occ  . Drug use: Never    Home Medications Prior to Admission medications   Medication Sig Start Date End Date Taking? Authorizing Provider  amLODipine (NORVASC) 10 MG tablet Take 1 tablet (10 mg total) by mouth daily. 07/20/18   Lendon Colonel, NP  furosemide (LASIX) 20 MG tablet Take 1 tablet (20 mg total) by mouth daily for 30 days. 07/17/18 08/16/18  Florencia Reasons, MD  losartan (COZAAR) 100 MG tablet Take 1 tablet (100 mg total) by mouth daily. 11/02/18 01/31/19  Lelon Perla, MD  ofloxacin (FLOXIN) 0.3 % OTIC solution Place 10 drops into the left ear daily for 7 days. 04/20/19 04/27/19  Justin Maize, PA-C  spironolactone (ALDACTONE) 25 MG tablet Take 1 tablet (25 mg total) by mouth daily. 11/02/18 01/31/19  Lelon Perla, MD    Allergies    Patient has no known allergies.  Review of Systems   Review of Systems  Constitutional: Positive for chills and fever.  HENT: Positive for ear pain, hearing loss and sore throat. Negative for ear discharge, trouble swallowing and voice change.   Respiratory: Positive for cough. Negative for shortness of breath.  Musculoskeletal: Positive for myalgias.    Physical Exam Updated Vital Signs BP (!) 157/108 (BP Location: Left Wrist) Comment: Simultaneous filing. User may not have seen previous data.  Pulse 82 Comment: Simultaneous filing. User may not have seen previous data.  Temp 100 F (37.8 C) (Oral) Comment: Simultaneous filing. User may not have seen previous data. Comment (Src): Simultaneous filing. User may not have seen previous data.  Resp 18 Comment: Simultaneous filing. User may not have seen previous data.  SpO2 96% Comment: Simultaneous filing. User may not have seen previous data.  Physical Exam Vitals and nursing note reviewed.  Constitutional:      Appearance: He is obese. He is not ill-appearing or  diaphoretic.  HENT:     Head: Normocephalic and atraumatic.     Right Ear: Tympanic membrane and external ear normal.     Left Ear: Tympanic membrane and external ear normal.     Ears:     Comments: + tenderness to palpation over mastoid; no obvious erythema or edema Eyes:     Conjunctiva/sclera: Conjunctivae normal.  Cardiovascular:     Rate and Rhythm: Normal rate and regular rhythm.     Pulses: Normal pulses.  Pulmonary:     Effort: Pulmonary effort is normal.     Breath sounds: Normal breath sounds. No wheezing or rales.  Skin:    General: Skin is warm and dry.     Coloration: Skin is not jaundiced.  Neurological:     Mental Status: He is alert.     ED Results / Procedures / Treatments   Labs (all labs ordered are listed, but only abnormal results are displayed) Labs Reviewed  BASIC METABOLIC PANEL - Abnormal; Notable for the following components:      Result Value   Glucose, Bld 102 (*)    Calcium 8.8 (*)    All other components within normal limits  CBC WITH DIFFERENTIAL/PLATELET - Abnormal; Notable for the following components:   WBC 3.5 (*)    RBC 5.90 (*)    Hemoglobin 18.1 (*)    HCT 54.4 (*)    Platelets 135 (*)    All other components within normal limits  CULTURE, BLOOD (ROUTINE X 2)  CULTURE, BLOOD (ROUTINE X 2)  LACTIC ACID, PLASMA  LACTIC ACID, PLASMA    EKG None  Radiology CT Soft Tissue Neck W Contrast  Result Date: 04/20/2019 CLINICAL DATA:  44 year old male positive for COVID-19. Last week with increasing left ear pain. Query mastoiditis. EXAM: CT NECK WITH CONTRAST TECHNIQUE: Multidetector CT imaging of the neck was performed using the standard protocol following the bolus administration of intravenous contrast. CONTRAST:  45m OMNIPAQUE IOHEXOL 300 MG/ML  SOLN COMPARISON:  None. FINDINGS: Pharynx and larynx: The glottis is closed, but otherwise the larynx is within normal limits. Pharyngeal soft tissue contours are within normal limits.  Parapharyngeal and retropharyngeal spaces appear negative. Salivary glands: Negative sublingual space. Submandibular glands and parotid glands are within normal limits. Thyroid: Negative. Lymph nodes: Negative. Vascular: Major vascular structures in the neck and at the skull base appear patent. Limited intracranial: Grossly negative visible brain parenchyma. Visualized orbits: Negative. Mastoids and visualized paranasal sinuses: The bilateral tympanic cavities and mastoids are clear. External auditory canals also appear normal. No periauricular soft tissue inflammation identified. There is mild bilateral paranasal sinus mucosal thickening. No sinus fluid levels. Skeleton: No acute dental finding identified. No acute osseous abnormality identified. Upper chest: Streak artifact at the thoracic inlet due to large body  habitus. Negative visible superior mediastinum. Visible upper lungs are within normal limits. IMPRESSION: 1. Normal CT appearance of the middle ears and mastoids. No explanation for left ear pain. 2. Normal CT appearance of the neck. 3. Mild paranasal sinus mucosal thickening, significance doubtful. Electronically Signed   By: Genevie Ann M.D.   On: 04/20/2019 13:27    Procedures Procedures (including critical care time)  Medications Ordered in ED Medications  acetaminophen (TYLENOL) tablet 650 mg (650 mg Oral Given 04/20/19 1124)  sodium chloride 0.9 % bolus 1,000 mL (1,000 mLs Intravenous New Bag/Given 04/20/19 1256)  iohexol (OMNIPAQUE) 300 MG/ML solution 75 mL (75 mLs Intravenous Contrast Given 04/20/19 1310)    ED Course  I have reviewed the triage vital signs and the nursing notes.  Pertinent labs & imaging results that were available during my care of the patient were reviewed by me and considered in my medical decision making (see chart for details).  Clinical Course as of Apr 20 1419  Wed Apr 20, 2019  1227 Lactic acid, plasma [MV]    Clinical Course User Index [MV] Justin Perez,  [place]   44 year old male who presents to the ED today sent from PCPs office with concern for mastoiditis.  Has been having ear pain on the left side for approximately 4 days.  Tested positive for COVID-19 as well.  Arrival to the ED patient is febrile with 100.0 temperature.  He is nontachycardic and nontachypneic.  He is overall well-appearing.  On exam his TM is clear.  He has tenderness to the mastoid area however no erythema or edema today.  No obvious external ear canal inflammation however patient describes an itching sensation and he reports he had tenderness to his tragus yesterday however today it is resolved.  And he has been sent over from PCPs with concern for mastoiditis will obtain CT scan at this time.  Will obtain screening labs.  Tylenol given elevated temperature.   CBC without leukocytosis. Hgb and HCT do appear hemoconcentrated today; will give fluids.  BMP with glucose 102, calcium mildly decreased at 8.8. No other electrolyte abnormalities.  Lactic acid within normal limits 0.9.   CT scan without acute findings today. No mastoiditis. Given patient is complaining of itching to the canal and has external ear tenderness will treat for external otitis media today. Pt also reports that his PCP told him he should discuss antibody infusions for his COVID diagnosis. Will call the antibody infusion center to schedule an appointment.   Antibody infusion center to call patient for appointment.   Pt advised to follow up with his PCP regarding his ED visit today. Strict return precautions discussed. He is in agreement with plan and stable for discharge home.   This note was prepared using Dragon voice recognition software and may include unintentional dictation errors due to the inherent limitations of voice recognition software.  TIGHE GITTO was evaluated in Emergency Department on 04/20/2019 for the symptoms described in the history of present illness. He was evaluated in the context of  the global COVID-19 pandemic, which necessitated consideration that the patient might be at risk for infection with the SARS-CoV-2 virus that causes COVID-19. Institutional protocols and algorithms that pertain to the evaluation of patients at risk for COVID-19 are in a state of rapid change based on information released by regulatory bodies including the CDC and federal and state organizations. These policies and algorithms were followed during the patient's care in the ED.  MDM Rules/Calculators/A&P  Final Clinical Impression(s) / ED Diagnoses Final diagnoses:  Acute otitis externa of left ear, unspecified type  COVID-19    Rx / DC Orders ED Discharge Orders         Ordered    ofloxacin (FLOXIN) 0.3 % OTIC solution  Daily     04/20/19 1420           Discharge Instructions     Your CT scan was reassuring today. There were no signs of mastoiditis.  Please pick up prescription and take as prescribed. Continue self quarantining given your COVID 19 positive diagnosis.  The infusion center should call you to schedule an appointment  Follow up with your PCP regarding your ED visit today       Justin Perez, Hershal Coria 04/20/19 1441    Fredia Sorrow, MD 04/24/19 [phone]

## 2019-04-20 NOTE — ED Notes (Signed)
Patient transported to CT 

## 2019-04-20 NOTE — ED Notes (Signed)
Patient given discharge instructions patient verbalizes understanding. 

## 2019-04-20 NOTE — ED Triage Notes (Signed)
Pt tested positive for COVID last week, pt reports worsening left ear pain since symptoms started. Pt sent here by PCP to rule out mastoiditis. Pt alert, denies cp/sob at this time. resp e.u

## 2019-04-21 ENCOUNTER — Ambulatory Visit (HOSPITAL_COMMUNITY)
Admission: RE | Admit: 2019-04-21 | Discharge: 2019-04-21 | Disposition: A | Discharge status: Home / Self Care | Payer: 59 | Source: Ambulatory Visit | Attending: Pulmonary Disease | Admitting: Pulmonary Disease

## 2019-04-21 DIAGNOSIS — U071 COVID-19: Secondary | ICD-10-CM | POA: Diagnosis present

## 2019-04-21 DIAGNOSIS — Z6841 Body Mass Index (BMI) 40.0 and over, adult: Secondary | ICD-10-CM | POA: Insufficient documentation

## 2019-04-21 MED ORDER — EPINEPHRINE 0.3 MG/0.3ML IJ SOAJ: 0.3000 mg | Freq: Once | INTRAMUSCULAR | Status: DC | PRN

## 2019-04-21 MED ORDER — ALBUTEROL SULFATE HFA 108 (90 BASE) MCG/ACT IN AERS: 2.0000 | INHALATION_SPRAY | Freq: Once | RESPIRATORY_TRACT | Status: DC | PRN

## 2019-04-21 MED ORDER — SODIUM CHLORIDE 0.9 % IV SOLN: INTRAVENOUS | Status: DC | PRN

## 2019-04-21 MED ORDER — DIPHENHYDRAMINE HCL 50 MG/ML IJ SOLN: 50.0000 mg | Freq: Once | INTRAMUSCULAR | Status: DC | PRN

## 2019-04-21 MED ORDER — METHYLPREDNISOLONE SODIUM SUCC 125 MG IJ SOLR: 125.0000 mg | Freq: Once | INTRAMUSCULAR | Status: DC | PRN

## 2019-04-21 MED ORDER — ACETAMINOPHEN 325 MG PO TABS
650.0000 mg | ORAL_TABLET | Freq: Four times a day (QID) | ORAL | Status: DC | PRN
  Administered 2019-04-21: 650 mg via ORAL
  Filled 2019-04-21: qty 2

## 2019-04-21 MED ORDER — FAMOTIDINE IN NACL 20-0.9 MG/50ML-% IV SOLN: 20.0000 mg | Freq: Once | INTRAVENOUS | Status: DC | PRN

## 2019-04-21 NOTE — Discharge Instructions (Signed)

## 2019-04-21 NOTE — Progress Notes (Signed)
  Diagnosis: COVID-19  Physician:dr wright  Procedure: [facility] Med: bamlanivimab infusion - Provided patient with bamlanimivab fact sheet for patients, parents and caregivers prior to infusion.  Complications: No immediate complications noted.  Discharge: Discharged home   [place] 04/21/2019

## 2019-04-25 LAB — CULTURE, BLOOD (ROUTINE X 2)
Culture: NO GROWTH
Culture: NO GROWTH
Special Requests: ADEQUATE

## 2019-05-10 IMAGING — US US EXTREM LOW VENOUS*L*
1 series · 13 of 24 positions shown · non-contrast
Comparison: None.

CLINICAL DATA: Left lower extremity swelling x3 days



[Series 1: us extrem low venous*left* · 0.11mm/px · 24 acquisitions, 13 frames shown]
[im 1/24]
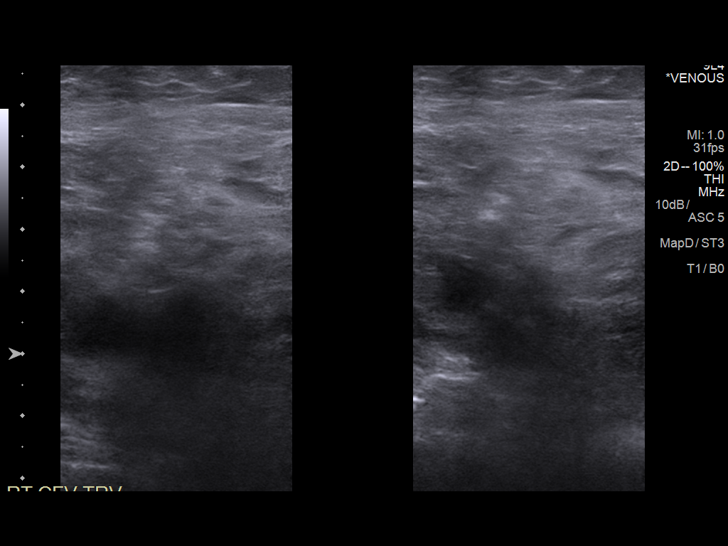
[im 3/24]
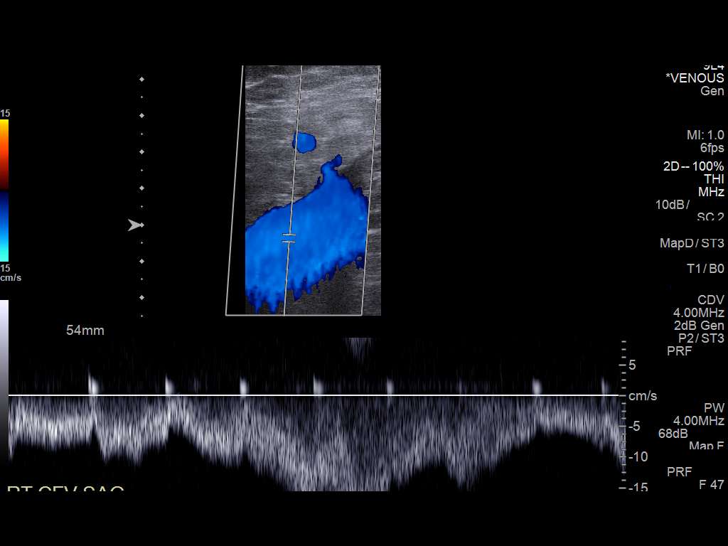
[im 5/24]
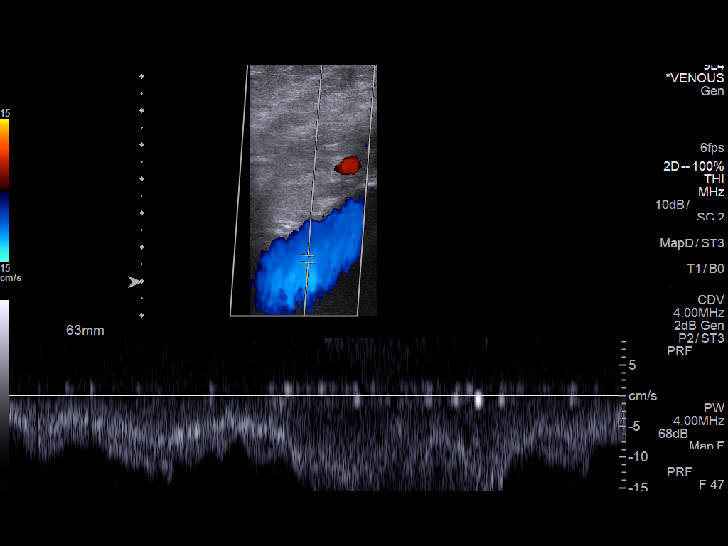
[im 7/24]
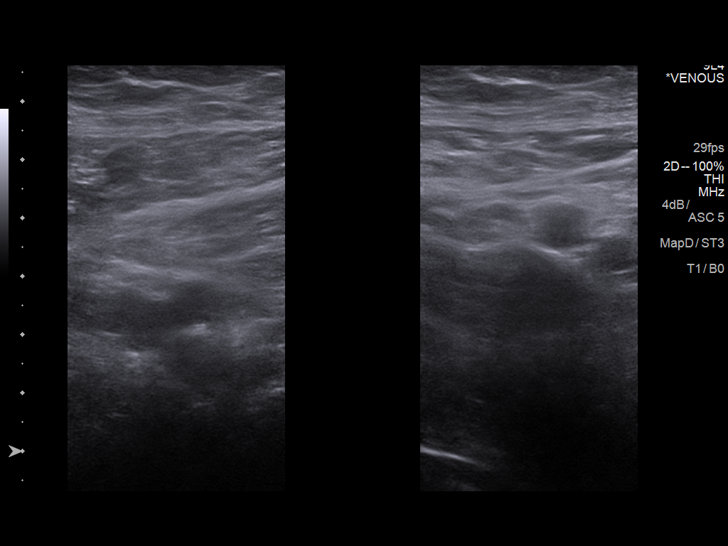
[im 9/24]
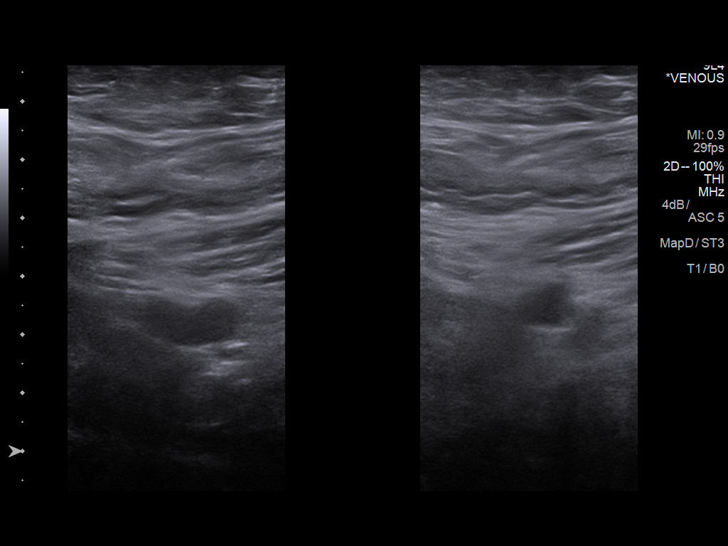
[im 11/24]
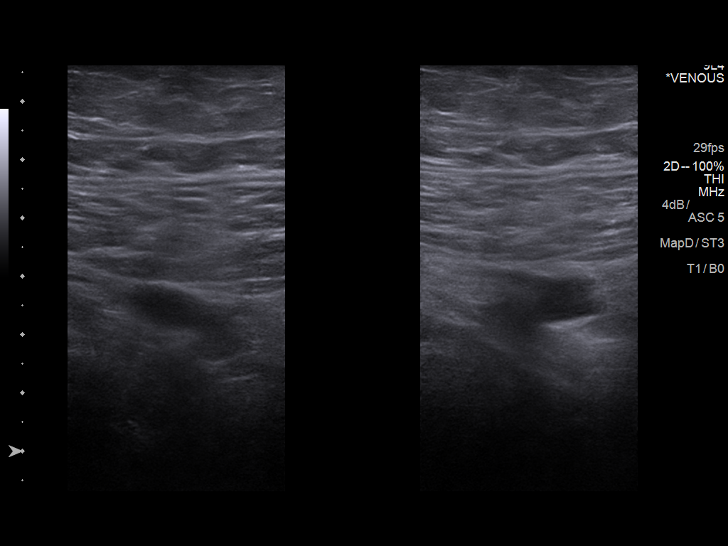
[im 14/24]
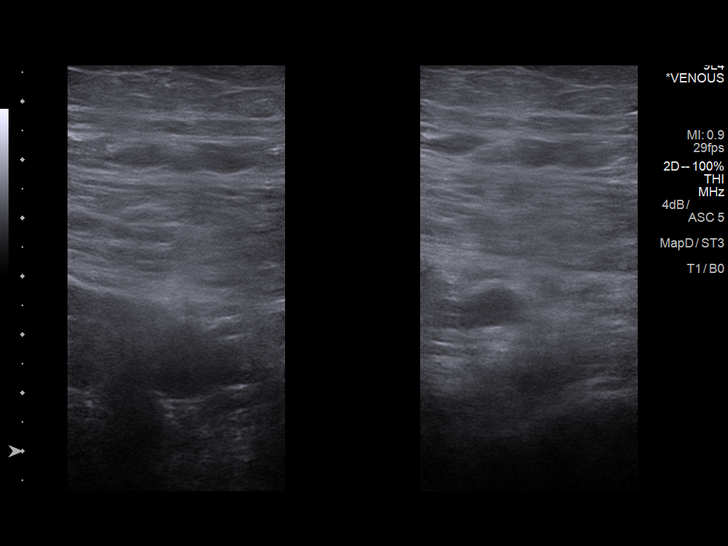
[im 15/24]
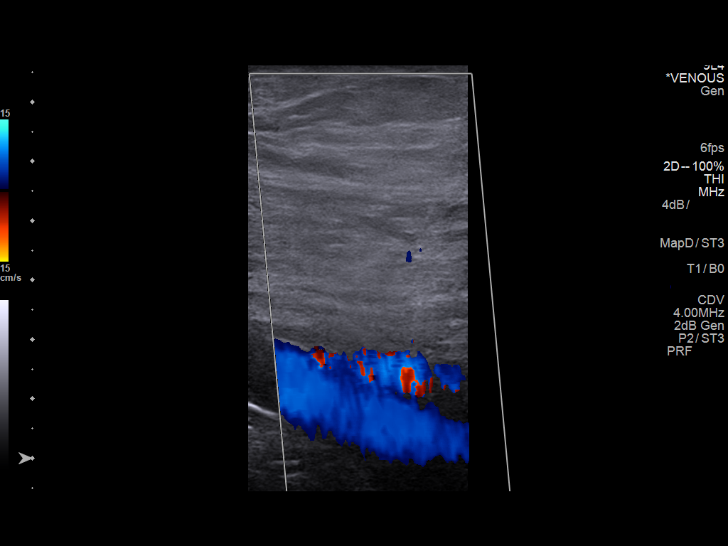
[im 17/24]
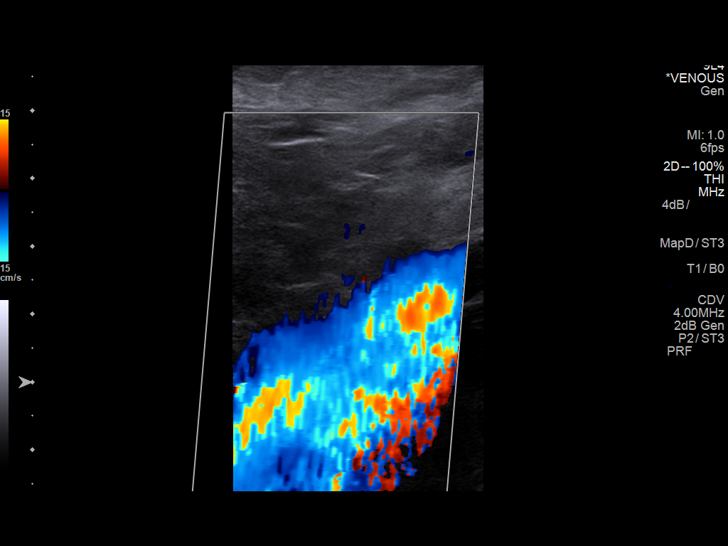
[im 19/24]
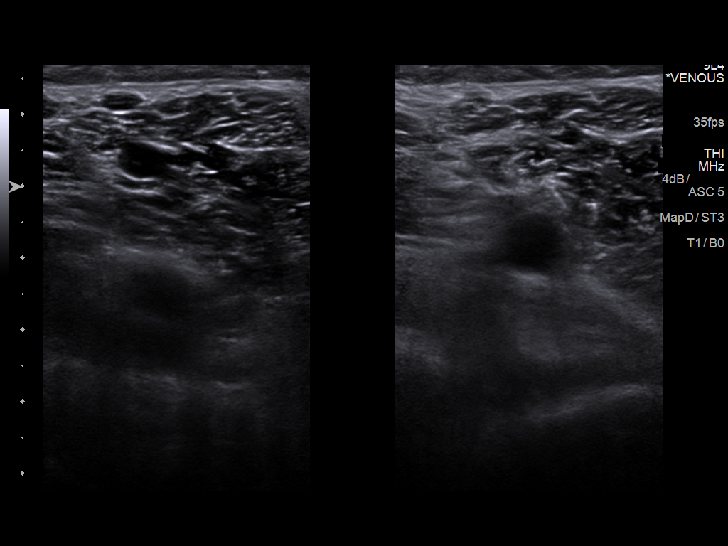
[im 21/24]
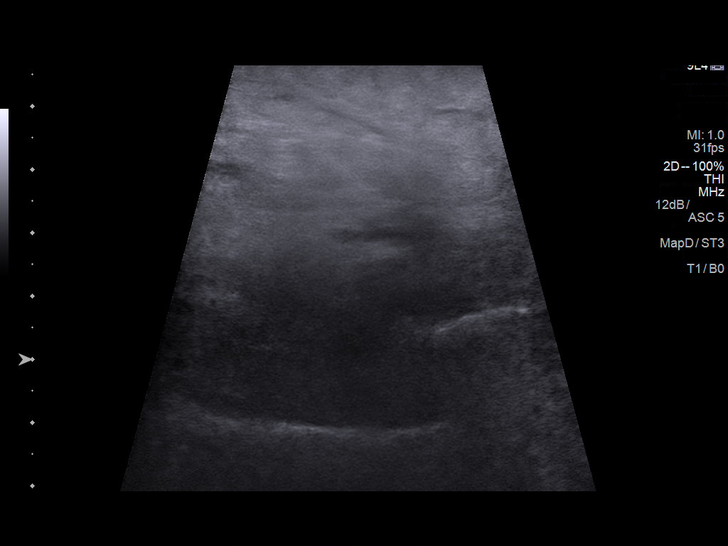
[im 22/24]
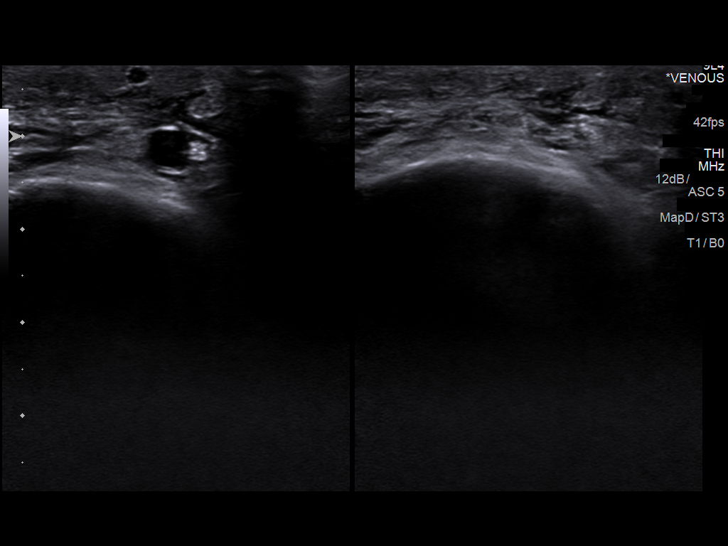
[im 24/24]
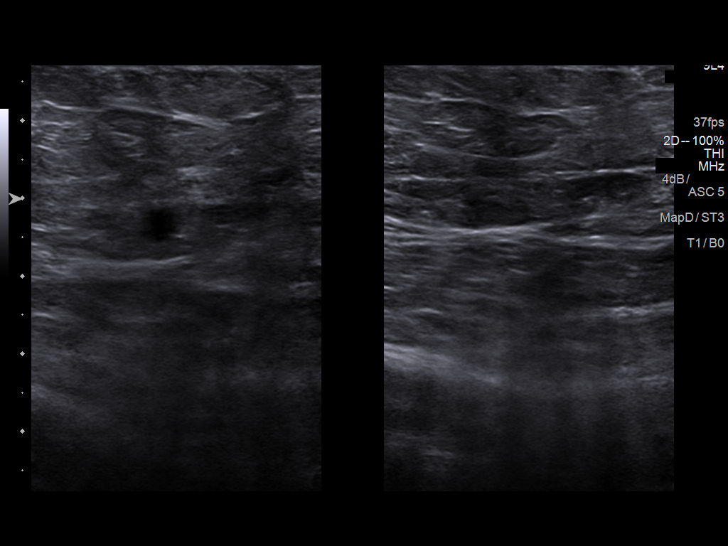

[13 of 24 positions shown; findings below may reference images not displayed]

FINDINGS: Contralateral Common Femoral Vein: Respiratory phasicity is normal
and symmetric with the symptomatic side. No evidence of thrombus.
Normal compressibility.

Common Femoral Vein: No evidence of thrombus. Normal
compressibility, respiratory phasicity and response to augmentation.

Saphenofemoral Junction: No evidence of thrombus. Normal
compressibility and flow on color Doppler imaging.

Profunda Femoral Vein: No evidence of thrombus. Normal
compressibility and flow on color Doppler imaging.

Femoral Vein: No evidence of thrombus. Normal compressibility,
respiratory phasicity and response to augmentation.

Popliteal Vein: No evidence of thrombus. Normal compressibility,
respiratory phasicity and response to augmentation.

Calf Veins: No evidence of thrombus. Normal compressibility and flow
on color Doppler imaging. Peroneal veins however not visualized.

Superficial Great Saphenous Vein: No evidence of thrombus. Normal
compressibility.

Venous Reflux:  None.

Other Findings:  None.
IMPRESSION: No evidence of deep venous thrombosis.

## 2019-08-04 ENCOUNTER — Other Ambulatory Visit: Payer: Self-pay | Admitting: Adult Health

## 2020-12-14 IMAGING — DX PORTABLE CHEST - 1 VIEW
2 series · 2 of 2 positions shown · non-contrast
Comparison: 05/22/2007

CLINICAL DATA: Shortness of breath and cough today

EXAM:
PORTABLE CHEST 1 VIEW

[chest ap (1 of 2)]
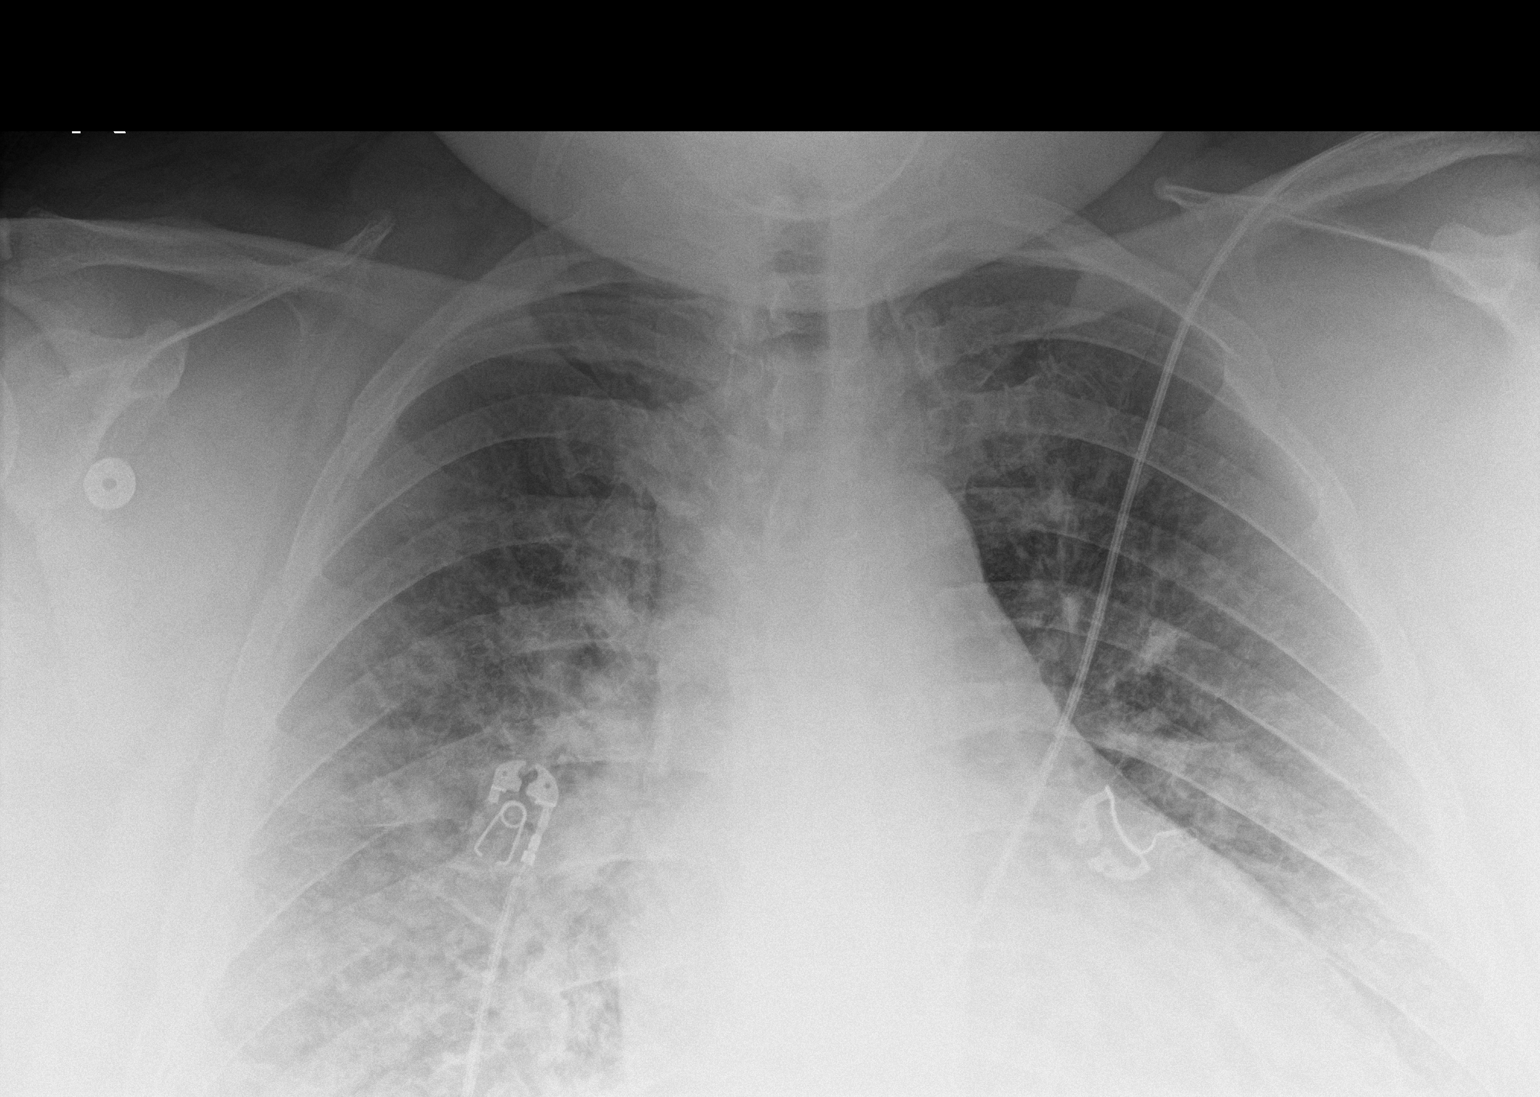

[chest ap (2 of 2)]
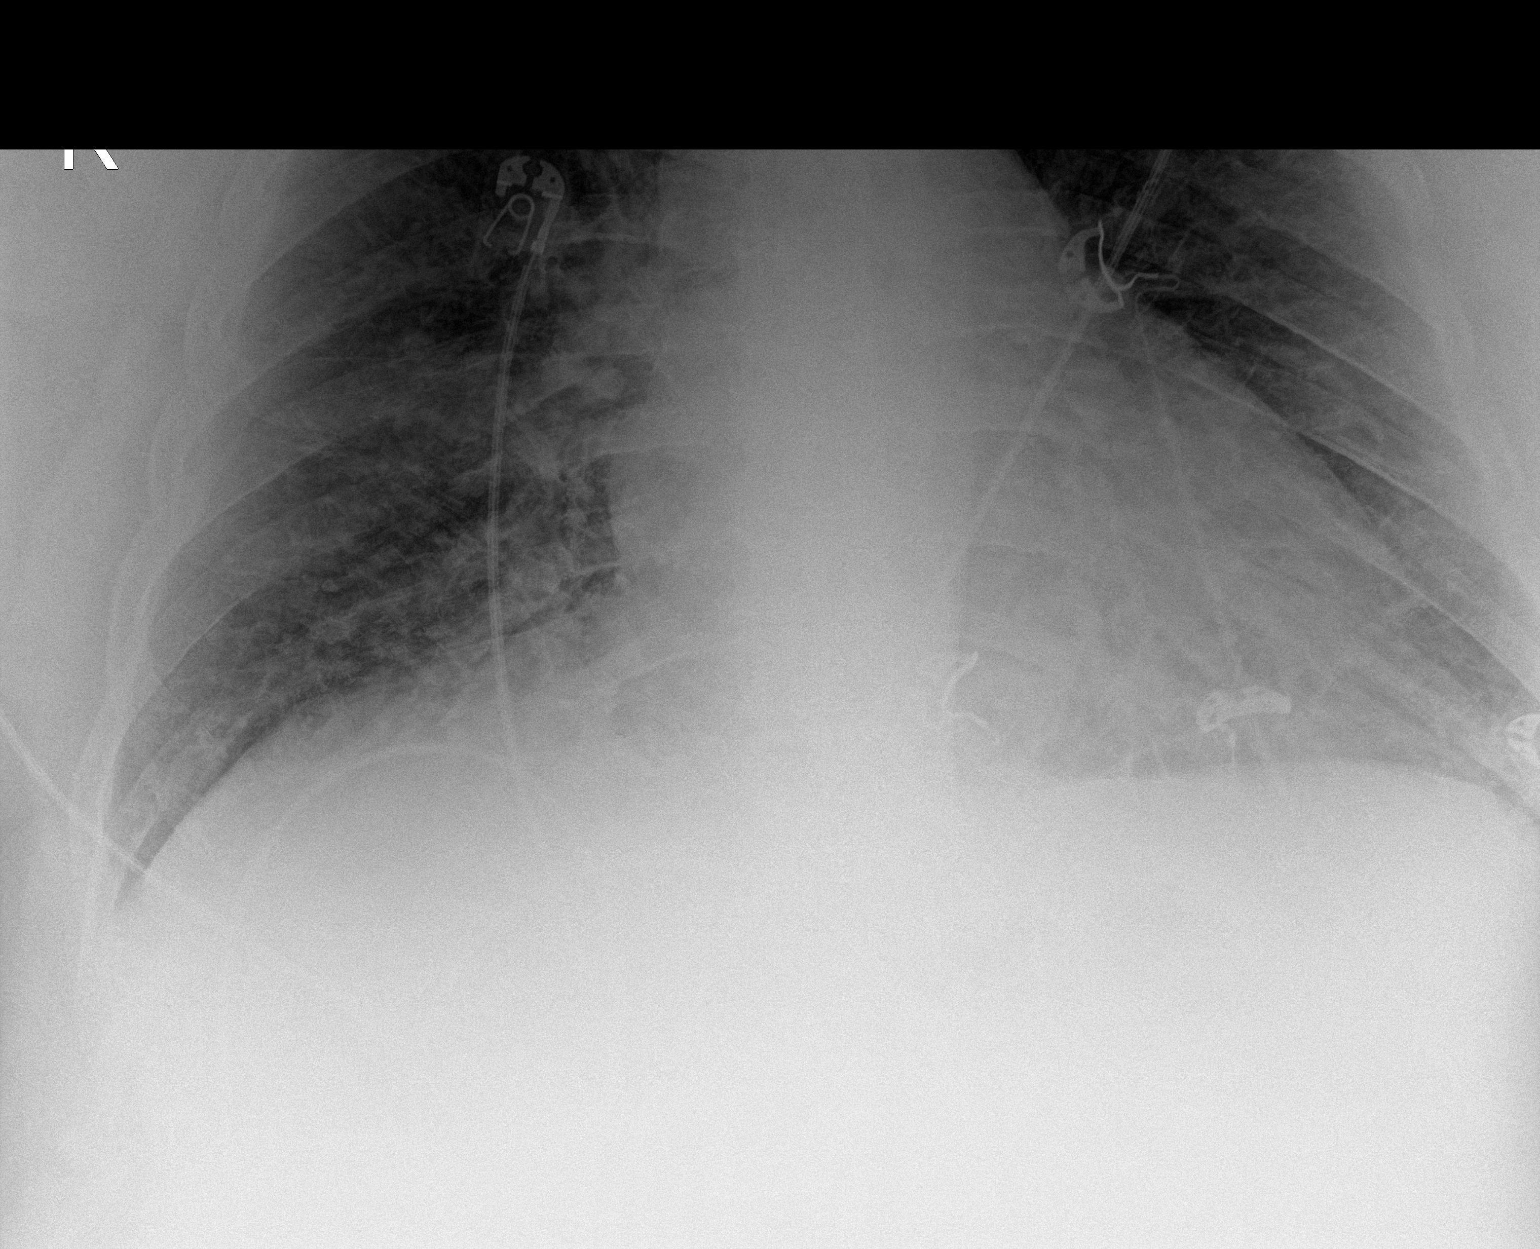

[2 of 2 positions shown; findings below may reference images not displayed]

FINDINGS: Cardiac shadow is mildly prominent but accentuated by the frontal
technique. Some atelectatic changes are noted in the right base
which improved somewhat on the second image. No other focal
abnormality is noted.
IMPRESSION: Mild right basilar atelectasis.

## 2020-12-14 IMAGING — CT CT ANGIOGRAPHY CHEST
2 of 7 series · 18 of 46 positions shown · IV contrast (APPLIED)
Comparison: Radiography from earlier today

CLINICAL DATA: Chest tightness.  PE suspected.

EXAM:
CT ANGIOGRAPHY CHEST WITH CONTRAST
TECHNIQUE: Multidetector CT imaging of the chest was performed using the
standard protocol during bolus administration of intravenous
contrast. Multiplanar CT image reconstructions and MIPs were
obtained to evaluate the vascular anatomy.
CONTRAST:  100mL OMNIPAQUE IOHEXOL 350 MG/ML SOLN

[Series 8: thins · axial · 0.92mm/px · z∈[+1023,+1272]mm · 15 of 403 slices shown]
[im 23/403  lung]
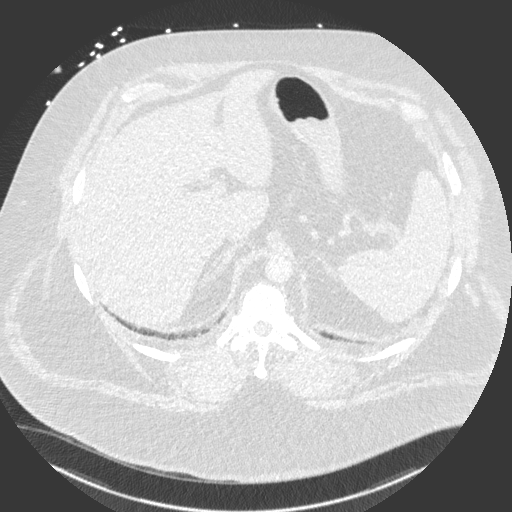
[im 45/403  soft-tissue]
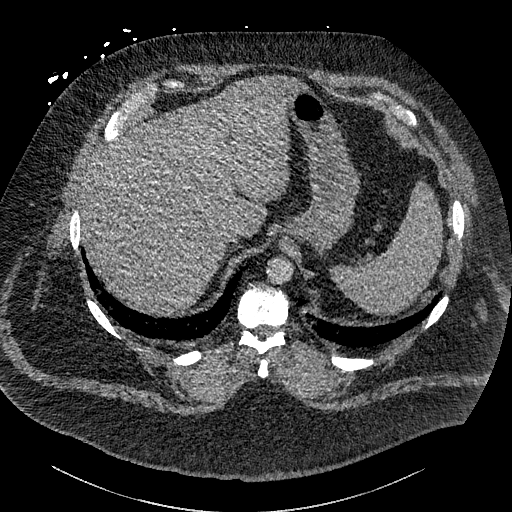
[im 68/403  lung]
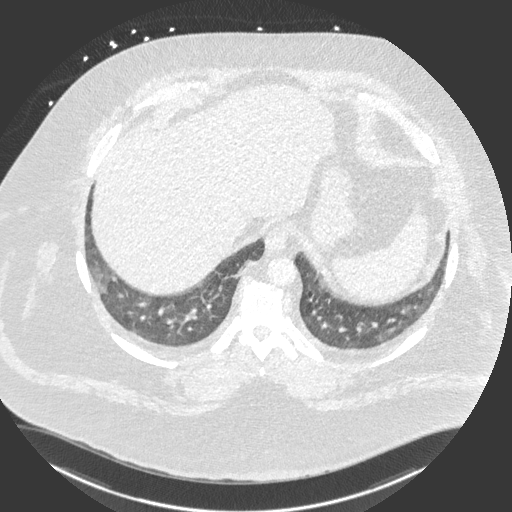
[im 90/403  soft-tissue]
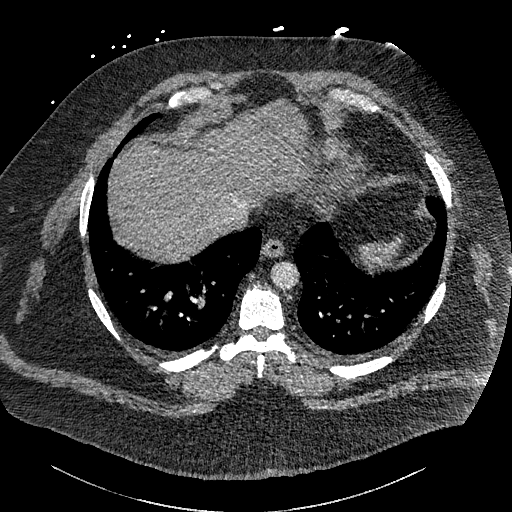
[im 135/403  lung]
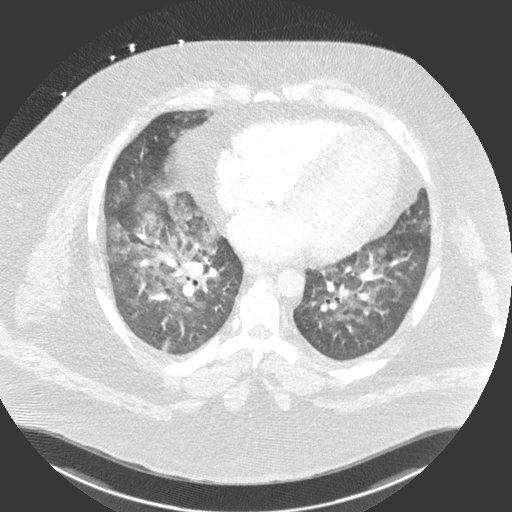
[im 157/403  soft-tissue]
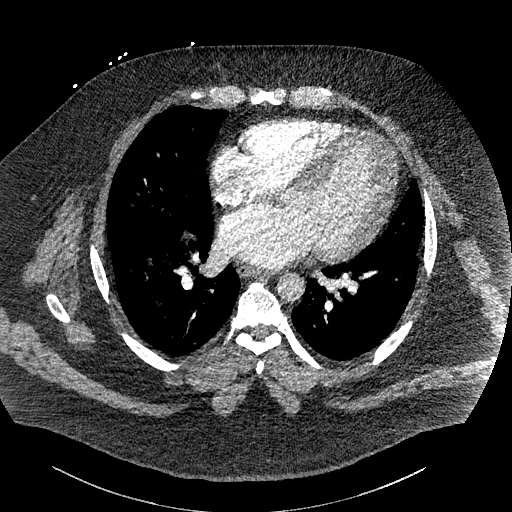
[im 179/403  lung]
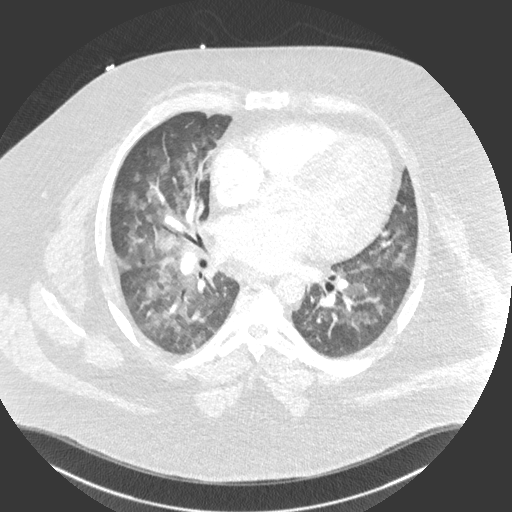
[im 202/403  soft-tissue]
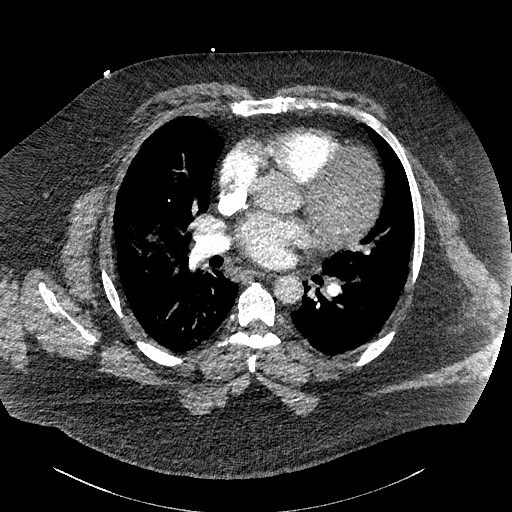
[im 224/403  lung]
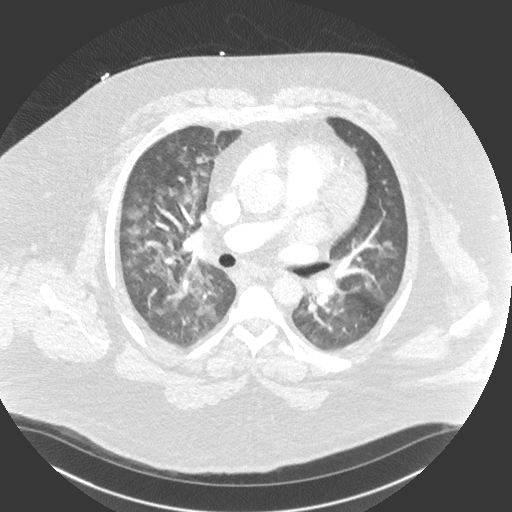
[im 246/403  soft-tissue]
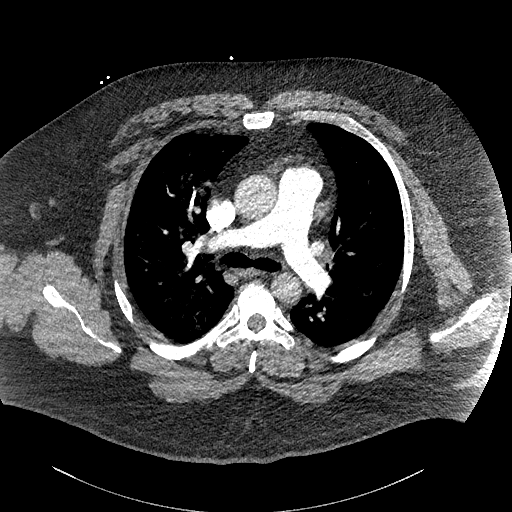
[im 269/403  lung]
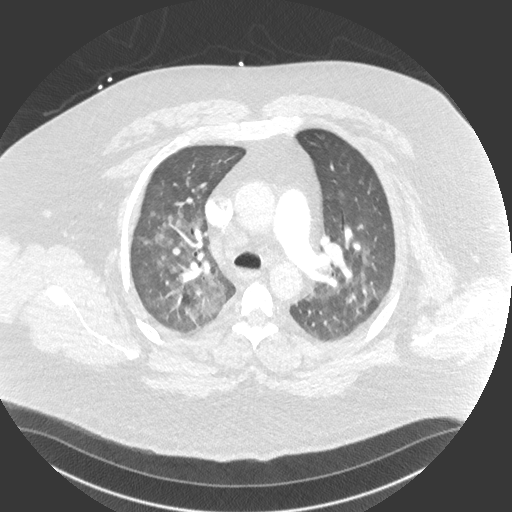
[im 313/403  soft-tissue]
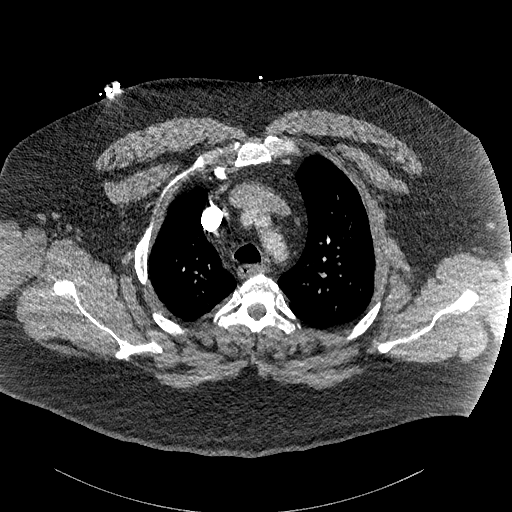
[im 336/403  lung]
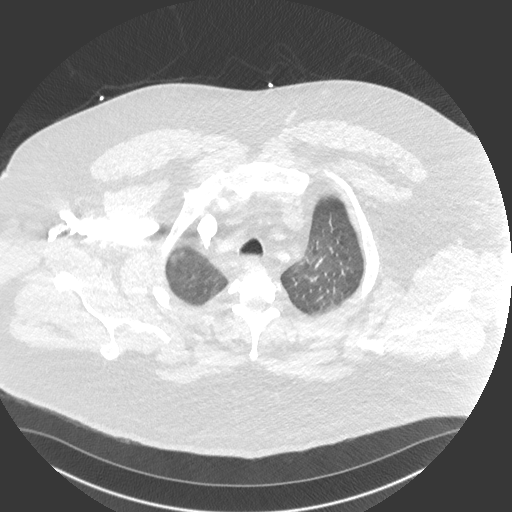
[im 358/403  soft-tissue]
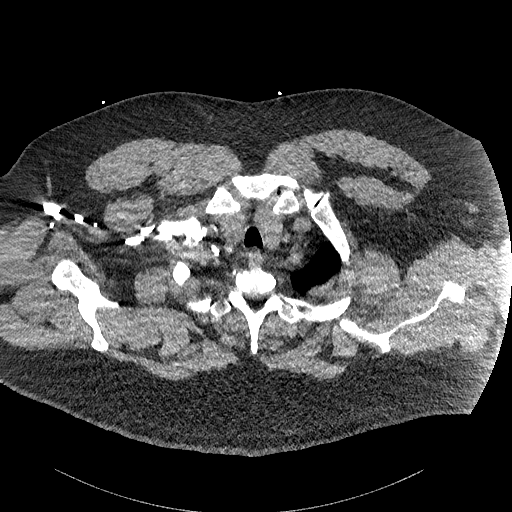
[im 380/403  lung]
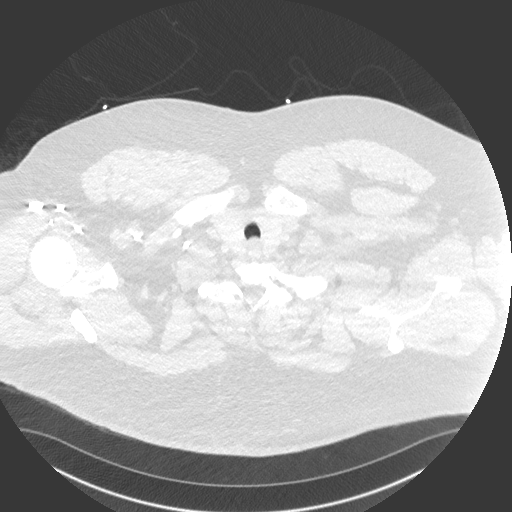

[Series 9: cor · coronal · 0.59mm/px · 3 of 168 slices shown]
[im 42/168  soft-tissue]
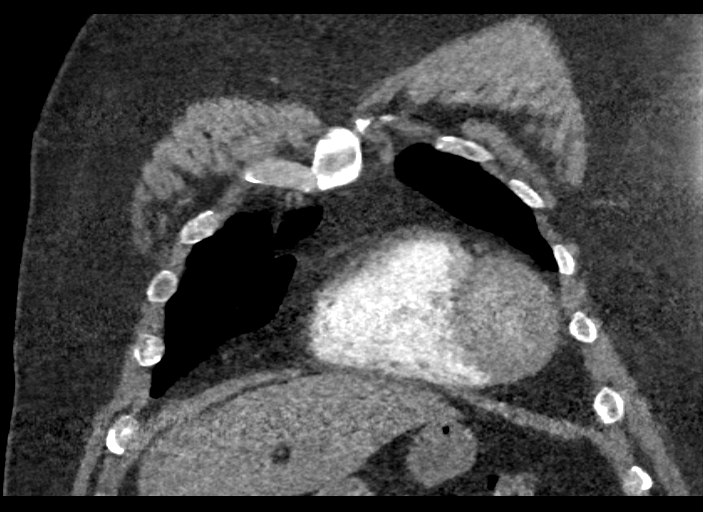
[im 84/168  soft-tissue]
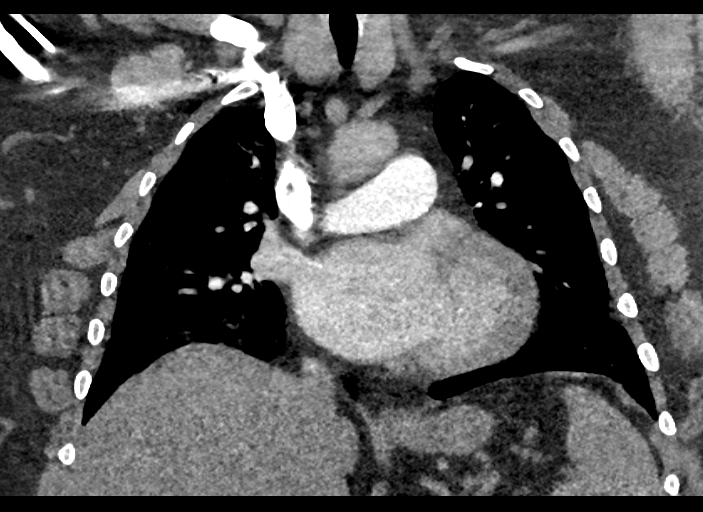
[im 126/168  soft-tissue]
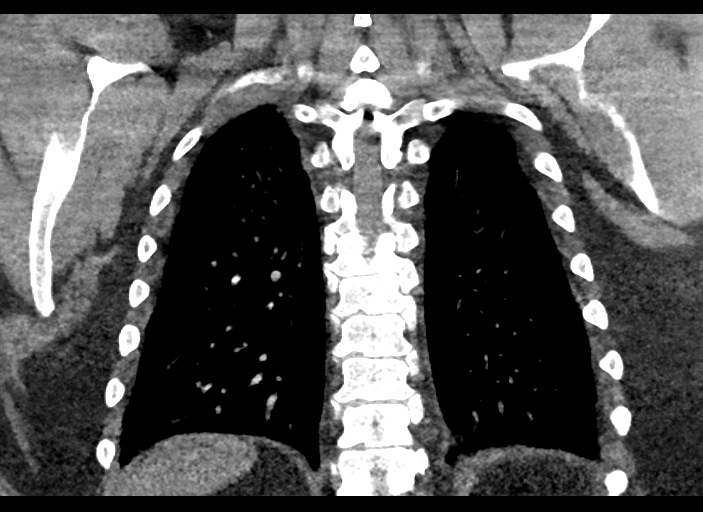

[18 of 46 positions shown; findings below may reference images not displayed]

FINDINGS: Cardiovascular: Borderline heart size. No pericardial effusion.
Negative for pulmonary embolism. No atherosclerotic calcification.

Mediastinum/Nodes: Negative for adenopathy or mass.

Lungs/Pleura: Bilateral airspace opacity with perihilar
predilection. There is a degree of septal thickening best seen at
the bases. Trace, if any, pleural fluid.

Upper Abdomen: Negative

Musculoskeletal: Spondylosis and disc narrowing.

Review of the MIP images confirms the above findings.
IMPRESSION: 1. Bilateral airspace disease and septal thickening which could be
multifocal pneumonia or edema.
2. Negative for pulmonary embolism.

## 2021-09-25 ENCOUNTER — Encounter (INDEPENDENT_AMBULATORY_CARE_PROVIDER_SITE_OTHER): Payer: Self-pay

## 2022-10-10 LAB — LAB REPORT - SCANNED: EGFR: 80.1

## 2022-10-15 ENCOUNTER — Telehealth (HOSPITAL_COMMUNITY): Payer: Self-pay | Admitting: *Deleted

## 2022-10-15 ENCOUNTER — Ambulatory Visit (HOSPITAL_COMMUNITY)
Admission: RE | Admit: 2022-10-15 | Discharge: 2022-10-15 | Disposition: A | Discharge status: Home / Self Care | Payer: 59 | Source: Ambulatory Visit | Attending: Internal Medicine | Admitting: Internal Medicine

## 2022-10-15 VITALS — BP 126/98 | HR 122 | Ht 72.0 in | Wt >= 6400 oz

## 2022-10-15 DIAGNOSIS — Z7901 Long term (current) use of anticoagulants: Secondary | ICD-10-CM | POA: Diagnosis not present

## 2022-10-15 DIAGNOSIS — E669 Obesity, unspecified: Secondary | ICD-10-CM | POA: Insufficient documentation

## 2022-10-15 DIAGNOSIS — Z6841 Body Mass Index (BMI) 40.0 and over, adult: Secondary | ICD-10-CM | POA: Insufficient documentation

## 2022-10-15 DIAGNOSIS — R9431 Abnormal electrocardiogram [ECG] [EKG]: Secondary | ICD-10-CM | POA: Insufficient documentation

## 2022-10-15 DIAGNOSIS — I4891 Unspecified atrial fibrillation: Secondary | ICD-10-CM | POA: Diagnosis not present

## 2022-10-15 DIAGNOSIS — I480 Paroxysmal atrial fibrillation: Secondary | ICD-10-CM | POA: Diagnosis not present

## 2022-10-15 MED ORDER — ELIQUIS 5 MG PO TABS: 5.0000 mg | ORAL_TABLET | Freq: Two times a day (BID) | ORAL | 3 refills | Status: DC

## 2022-10-15 MED ORDER — METOPROLOL TARTRATE 25 MG PO TABS: 25.0000 mg | ORAL_TABLET | Freq: Two times a day (BID) | ORAL | 1 refills | Status: DC

## 2022-10-15 NOTE — Patient Instructions (Addendum)
Increase metoprolol to 50mg  twice a day until Friday then go back to 25mg  twice a day   Cardioversion scheduled for: Friday, August 30th   - Arrive at the [facility] and go to admitting at [facility] not eat or drink anything after midnight the night prior to your procedure.   - Take all your morning medication (except diabetic medications) with a sip of water prior to arrival.  - You will not be able to drive home after your procedure.    - Do NOT miss any doses of your blood thinner - if you should miss a dose please notify our office immediately.   - If you feel as if you go back into normal rhythm prior to scheduled cardioversion, please notify our office immediately.   If your procedure is canceled in the cardioversion suite you will be charged a cancellation fee.

## 2022-10-15 NOTE — Progress Notes (Signed)
Primary Care Physician: Farris Has, MD Primary Cardiologist: Olga Millers, MD Electrophysiologist: None     Referring Physician: [facility]     Justin Perez is a 47 y.o. male with a history of obesity, OSA, HTN, atrial fibrillation who presents for consultation in the [facility]. Symptoms of chest pain and SOB seen by PCP noted to be in Afib with RVR on 10/10/22. He was prescribed Lopressor 25 mg BID. Patient is on Eliquis for a CHADS2VASC score of 1.  On evaluation today, he is currently in Afib with RVR. He drinks 3 - 16 oz diet cokes daily. He drinks occasional alcohol. He does have sleep apnea but admits to sometimes falling asleep without CPAP mask on his face and will wake up gasping for air. He notes to have SOB and palpitations and lower extremity swelling and dizziness. He began full anticoagulation with Eliquis on 8/23.   Today, he denies symptoms of chest pain, orthopnea, PND, presyncope, syncope, snoring, daytime somnolence, bleeding, or neurologic sequela. The patient is tolerating medications without difficulties and is otherwise without complaint today.    Atrial Fibrillation Risk Factors:  he does have symptoms or diagnosis of sleep apnea. he is not compliant with CPAP therapy.  he has a BMI of Body mass index is 57.21 kg/m.Marland Kitchen Filed Weights   10/15/22 0934  Weight: (!) 191.3 kg    Current Outpatient Medications  Medication Sig Dispense Refill   hydrochlorothiazide (HYDRODIURIL) 12.5 MG tablet 1 tablet in the morning Orally Once a day     losartan (COZAAR) 100 MG tablet Take 1 tablet (100 mg total) by mouth daily. 90 tablet 3   pantoprazole (PROTONIX) 40 MG tablet 1 tablet Orally Once a day for 30 days     ELIQUIS 5 MG TABS tablet Take 1 tablet (5 mg total) by mouth 2 (two) times daily. 60 tablet 3   metoprolol tartrate (LOPRESSOR) 25 MG tablet Take 1 tablet (25 mg total) by mouth 2 (two) times daily. May take  extra tablet daily for breakthrough afib 90 tablet 1   No current facility-administered medications for this encounter.    Atrial Fibrillation Management history:  Previous antiarrhythmic drugs: None Previous cardioversions: None Previous ablations: None Anticoagulation history: Eliquis  ROS- All systems are reviewed and negative except as per the HPI above.  Physical Exam: BP (!) 126/98   Pulse (!) 122   Ht 6' (1.829 m)   Wt (!) 191.3 kg   BMI 57.21 kg/m   GEN: Well nourished, well developed in no acute distress NECK: No JVD; No carotid bruits CARDIAC: Irregularly irregular tachycardic rate and rhythm, no murmurs, rubs, gallops RESPIRATORY:  Clear to auscultation without rales, wheezing or rhonchi  ABDOMEN: Soft, non-tender, non-distended EXTREMITIES:  No edema; No deformity   EKG today demonstrates  Vent. rate 122 BPM PR interval * ms QRS duration 102 ms QT/QTcB 332/473 ms P-R-T axes * -72 69 Atrial fibrillation with rapid ventricular response Left axis deviation Low voltage QRS Cannot rule out Anterior infarct , age undetermined Abnormal ECG When compared with ECG of 15-Jul-2018 06:56, PREVIOUS ECG IS PRESENT  Echo 07/14/18 demonstrated  1. The left ventricle has normal systolic function, with an ejection  fraction of 55-60%. The cavity size was normal. There is moderately  increased left ventricular wall thickness. Left ventricular diastolic  parameters were normal. Indeterminate filling  pressures.   2. The right ventricle has normal systolic function. The cavity was  normal.  There is no increase in right ventricular wall thickness.   3. Left atrial size was moderately dilated.   4. Right atrial size was mildly dilated.   5. No evidence of mitral valve stenosis.   6. No stenosis of the aortic valve.   ASSESSMENT & PLAN CHA2DS2-VASc Score = 1  The patient's score is based upon: CHF History: 0 HTN History: 1 Diabetes History: 0 Stroke History:  0 Vascular Disease History: 0 Age Score: 0 Gender Score: 0       ASSESSMENT AND PLAN: Paroxysmal Atrial Fibrillation (ICD10:  I48.0) - suspect persistent Afib The patient's CHA2DS2-VASc score is 1, indicating a 0.6% annual risk of stroke.    He is currently in Afib with RVR.  Education provided about Afib. Discussion about medication treatments and going forward if indicated. He is not a candidate for ablation due to BMI. We discussed procedure of cardioversion. Due to his acute symptoms, discussion of TEE/DCCV as well. After discussion, we will proceed with scheduling TEE/DCCV per patient's request. Rhythm monitoring device recommended. He will require an updated echocardiogram when he is in NSR. Emphasis on consistent CPAP use. Encouragement with weight loss and goal for 10% weight reduction. Transition to less caffeinated beverages.   Informed Consent   Shared Decision Making/Informed Consent The risks [stroke, cardiac arrhythmias rarely resulting in the need for a temporary or permanent pacemaker, skin irritation or burns, esophageal damage, perforation (1:10,000 risk), bleeding, pharyngeal hematoma as well as other potential complications associated with conscious sedation including aspiration, arrhythmia, respiratory failure and death], benefits (treatment guidance, restoration of normal sinus rhythm, diagnostic support) and alternatives of a transesophageal echocardiogram guided cardioversion were discussed in detail with Mr. Slaght and he is willing to proceed.       Continue Eliquis 5 mg BID - he has taken over 5 doses without interruption.  Follow up 2 weeks after DCCV.   Lake Bells, PA-C  Afib Clinic [facility] [address] [place], [place] [address] [phone]

## 2022-10-15 NOTE — H&P (View-Only) (Signed)
 Primary Care Physician: Farris Has, MD Primary Cardiologist: Olga Millers, MD Electrophysiologist: None     Referring Physician: [facility]     Justin Perez is a 47 y.o. male with a history of obesity, OSA, HTN, atrial fibrillation who presents for consultation in the [facility]rial [facility]ibr[facility]. Symptoms of chest pain and SOB seen by PCP noted to be in Afib with RVR on 10/10/22. He was prescribed Lopressor 25 mg BID. Patient is on Eliquis for a CHADS2VASC score of 1.  On evaluation today, he is currently in Afib with RVR. He drinks 3 - 16 oz diet cokes daily. He drinks occasional alcohol. He does have sleep apnea but admits to sometimes falling asleep without CPAP mask on his face and will wake up gasping for air. He notes to have SOB and palpitations and lower extremity swelling and dizziness. He began full anticoagulation with Eliquis on 8/23.   Today, he denies symptoms of chest pain, orthopnea, PND, presyncope, syncope, snoring, daytime somnolence, bleeding, or neurologic sequela. The patient is tolerating medications without difficulties and is otherwise without complaint today.    Atrial Fibrillation Risk Factors:  he does have symptoms or diagnosis of sleep apnea. he is not compliant with CPAP therapy.  he has a BMI of Body mass index is 57.21 kg/m.Marland Kitchen Filed Weights   10/15/22 0934  Weight: (!) 191.3 kg    Current Outpatient Medications  Medication Sig Dispense Refill   hydrochlorothiazide (HYDRODIURIL) 12.5 MG tablet 1 tablet in the morning Orally Once a day     losartan (COZAAR) 100 MG tablet Take 1 tablet (100 mg total) by mouth daily. 90 tablet 3   pantoprazole (PROTONIX) 40 MG tablet 1 tablet Orally Once a day for 30 days     ELIQUIS 5 MG TABS tablet Take 1 tablet (5 mg total) by mouth 2 (two) times daily. 60 tablet 3   metoprolol tartrate (LOPRESSOR) 25 MG tablet Take 1 tablet (25 mg total) by mouth 2 (two) times daily. May take  extra tablet daily for breakthrough afib 90 tablet 1   No current facility-administered medications for this encounter.    Atrial Fibrillation Management history:  Previous antiarrhythmic drugs: None Previous cardioversions: None Previous ablations: None Anticoagulation history: Eliquis  ROS- All systems are reviewed and negative except as per the HPI above.  Physical Exam: BP (!) 126/98   Pulse (!) 122   Ht 6' (1.829 m)   Wt (!) 191.3 kg   BMI 57.21 kg/m   GEN: Well nourished, well developed in no acute distress NECK: No JVD; No carotid bruits CARDIAC: Irregularly irregular tachycardic rate and rhythm, no murmurs, rubs, gallops RESPIRATORY:  Clear to auscultation without rales, wheezing or rhonchi  ABDOMEN: Soft, non-tender, non-distended EXTREMITIES:  No edema; No deformity   EKG today demonstrates  Vent. rate 122 BPM PR interval * ms QRS duration 102 ms QT/QTcB 332/473 ms P-R-T axes * -72 69 Atrial fibrillation with rapid ventricular response Left axis deviation Low voltage QRS Cannot rule out Anterior infarct , age undetermined Abnormal ECG When compared with ECG of 15-Jul-2018 06:56, PREVIOUS ECG IS PRESENT  Echo 07/14/18 demonstrated  1. The left ventricle has normal systolic function, with an ejection  fraction of 55-60%. The cavity size was normal. There is moderately  increased left ventricular wall thickness. Left ventricular diastolic  parameters were normal. Indeterminate filling  pressures.   2. The right ventricle has normal systolic function. The cavity was  normal.  There is no increase in right ventricular wall thickness.   3. Left atrial size was moderately dilated.   4. Right atrial size was mildly dilated.   5. No evidence of mitral valve stenosis.   6. No stenosis of the aortic valve.   ASSESSMENT & PLAN CHA2DS2-VASc Score = 1  The patient's score is based upon: CHF History: 0 HTN History: 1 Diabetes History: 0 Stroke History:  0 Vascular Disease History: 0 Age Score: 0 Gender Score: 0       ASSESSMENT AND PLAN: Paroxysmal Atrial Fibrillation (ICD10:  I48.0) - suspect persistent Afib The patient's CHA2DS2-VASc score is 1, indicating a 0.6% annual risk of stroke.    He is currently in Afib with RVR.  Education provided about Afib. Discussion about medication treatments and going forward if indicated. He is not a candidate for ablation due to BMI. We discussed procedure of cardioversion. Due to his acute symptoms, discussion of TEE/DCCV as well. After discussion, we will proceed with scheduling TEE/DCCV per patient's request. Rhythm monitoring device recommended. He will require an updated echocardiogram when he is in NSR. Emphasis on consistent CPAP use. Encouragement with weight loss and goal for 10% weight reduction. Transition to less caffeinated beverages.   Informed Consent   Shared Decision Making/Informed Consent The risks [stroke, cardiac arrhythmias rarely resulting in the need for a temporary or permanent pacemaker, skin irritation or burns, esophageal damage, perforation (1:10,000 risk), bleeding, pharyngeal hematoma as well as other potential complications associated with conscious sedation including aspiration, arrhythmia, respiratory failure and death], benefits (treatment guidance, restoration of normal sinus rhythm, diagnostic support) and alternatives of a transesophageal echocardiogram guided cardioversion were discussed in detail with Mr. Slaght and he is willing to proceed.       Continue Eliquis 5 mg BID - he has taken over 5 doses without interruption.  Follow up 2 weeks after DCCV.   Lake Bells, PA-C  Afib Clinic [facility] [address] [place], [place] [address] [phone]

## 2022-10-16 NOTE — Progress Notes (Signed)
Spoke to pt and instructed them to come at 1030 and to be NPO after 0000. Confirmed no missed doses of AC and instructed to take in AM with a small sip of water.   Confirmed that pt will have a ride home and someone to stay with them for 24 hours after the procedure.

## 2022-10-17 ENCOUNTER — Ambulatory Visit (HOSPITAL_BASED_OUTPATIENT_CLINIC_OR_DEPARTMENT_OTHER)
Admission: RE | Admit: 2022-10-17 | Discharge: 2022-10-17 | Disposition: A | Discharge status: Home / Self Care | Payer: 59 | Source: Ambulatory Visit | Attending: Internal Medicine | Admitting: Internal Medicine

## 2022-10-17 ENCOUNTER — Ambulatory Visit (HOSPITAL_BASED_OUTPATIENT_CLINIC_OR_DEPARTMENT_OTHER): Payer: 59 | Admitting: Anesthesiology

## 2022-10-17 ENCOUNTER — Ambulatory Visit (HOSPITAL_COMMUNITY): Payer: 59 | Admitting: Anesthesiology

## 2022-10-17 ENCOUNTER — Other Ambulatory Visit: Payer: Self-pay

## 2022-10-17 ENCOUNTER — Ambulatory Visit (HOSPITAL_COMMUNITY)
Admission: RE | Admit: 2022-10-17 | Discharge: 2022-10-17 | Disposition: A | Discharge status: Home / Self Care | Payer: 59 | Attending: Cardiology | Admitting: Cardiology

## 2022-10-17 ENCOUNTER — Encounter (HOSPITAL_COMMUNITY)
Admission: RE | Disposition: A | Discharge status: Home / Self Care | Payer: Self-pay | Source: Home / Self Care | Attending: Cardiology

## 2022-10-17 ENCOUNTER — Encounter (HOSPITAL_COMMUNITY): Payer: Self-pay | Admitting: Cardiology

## 2022-10-17 DIAGNOSIS — E669 Obesity, unspecified: Secondary | ICD-10-CM | POA: Diagnosis not present

## 2022-10-17 DIAGNOSIS — Z7901 Long term (current) use of anticoagulants: Secondary | ICD-10-CM | POA: Diagnosis not present

## 2022-10-17 DIAGNOSIS — I340 Nonrheumatic mitral (valve) insufficiency: Secondary | ICD-10-CM

## 2022-10-17 DIAGNOSIS — G4733 Obstructive sleep apnea (adult) (pediatric): Secondary | ICD-10-CM | POA: Diagnosis not present

## 2022-10-17 DIAGNOSIS — I4891 Unspecified atrial fibrillation: Secondary | ICD-10-CM

## 2022-10-17 DIAGNOSIS — I4819 Other persistent atrial fibrillation: Secondary | ICD-10-CM | POA: Diagnosis not present

## 2022-10-17 DIAGNOSIS — I10 Essential (primary) hypertension: Secondary | ICD-10-CM | POA: Insufficient documentation

## 2022-10-17 DIAGNOSIS — I480 Paroxysmal atrial fibrillation: Secondary | ICD-10-CM

## 2022-10-17 DIAGNOSIS — I509 Heart failure, unspecified: Secondary | ICD-10-CM

## 2022-10-17 DIAGNOSIS — I361 Nonrheumatic tricuspid (valve) insufficiency: Secondary | ICD-10-CM | POA: Diagnosis not present

## 2022-10-17 DIAGNOSIS — I110 Hypertensive heart disease with heart failure: Secondary | ICD-10-CM | POA: Diagnosis not present

## 2022-10-17 DIAGNOSIS — Z6841 Body Mass Index (BMI) 40.0 and over, adult: Secondary | ICD-10-CM | POA: Diagnosis not present

## 2022-10-17 DIAGNOSIS — I081 Rheumatic disorders of both mitral and tricuspid valves: Secondary | ICD-10-CM | POA: Insufficient documentation

## 2022-10-17 HISTORY — PX: TEE WITHOUT CARDIOVERSION: SHX5443

## 2022-10-17 HISTORY — PX: CARDIOVERSION: SHX1299

## 2022-10-17 LAB — POCT I-STAT, CHEM 8
BUN: 16 mg/dL (ref 6–20)
Calcium, Ion: 1.23 mmol/L (ref 1.15–1.40)
Chloride: 104 mmol/L (ref 98–111)
Creatinine, Ser: 1 mg/dL (ref 0.61–1.24)
Glucose, Bld: 108 mg/dL — ABNORMAL HIGH (ref 70–99)
HCT: 46 % (ref 39.0–52.0)
Hemoglobin: 15.6 g/dL (ref 13.0–17.0)
Potassium: 3.9 mmol/L (ref 3.5–5.1)
Sodium: 142 mmol/L (ref 135–145)
TCO2: 23 mmol/L (ref 22–32)

## 2022-10-17 LAB — ECHO TEE

## 2022-10-17 SURGERY — ECHOCARDIOGRAM, TRANSESOPHAGEAL
Anesthesia: General

## 2022-10-17 MED ORDER — FENTANYL CITRATE (PF) 100 MCG/2ML IJ SOLN
INTRAMUSCULAR | Status: AC
  Filled 2022-10-17: qty 2

## 2022-10-17 MED ORDER — METOPROLOL TARTRATE 50 MG PO TABS: 50.0000 mg | ORAL_TABLET | Freq: Two times a day (BID) | ORAL | 2 refills | Status: DC

## 2022-10-17 MED ORDER — SODIUM CHLORIDE 0.9 % IV SOLN: INTRAVENOUS | Status: DC

## 2022-10-17 MED ORDER — PROPOFOL 500 MG/50ML IV EMUL
INTRAVENOUS | Status: DC | PRN
  Administered 2022-10-17: 100 ug/kg/min via INTRAVENOUS

## 2022-10-17 MED ORDER — FENTANYL CITRATE (PF) 100 MCG/2ML IJ SOLN
INTRAMUSCULAR | Status: DC | PRN
  Administered 2022-10-17: 50 ug via INTRAVENOUS

## 2022-10-17 MED ORDER — LIDOCAINE 2% (20 MG/ML) 5 ML SYRINGE
INTRAMUSCULAR | Status: DC | PRN
  Administered 2022-10-17: 20 mg via INTRAVENOUS

## 2022-10-17 MED ORDER — PROPOFOL 10 MG/ML IV BOLUS
INTRAVENOUS | Status: DC | PRN
  Administered 2022-10-17: 50 mg via INTRAVENOUS

## 2022-10-17 MED ORDER — ONDANSETRON HCL 4 MG/2ML IJ SOLN
INTRAMUSCULAR | Status: DC | PRN
  Administered 2022-10-17: 4 mg via INTRAVENOUS

## 2022-10-17 SURGICAL SUPPLY — 1 items: ELECT DEFIB PAD ADLT CADENCE (PAD) ×1 IMPLANT

## 2022-10-17 NOTE — CV Procedure (Signed)
   TRANSESOPHAGEAL ECHOCARDIOGRAM GUIDED DIRECT CURRENT CARDIOVERSION  NAME:  Justin Perez   MRN: [id number] DOB:  [birth year]   ADMIT DATE: 10/17/2022  INDICATIONS: Symptomatic atrial fibrillation  PROCEDURE:   Informed consent was obtained prior to the procedure. The risks, benefits and alternatives for the procedure were discussed and the patient comprehended these risks.  Risks include, but are not limited to, cough, sore throat, vomiting, nausea, somnolence, esophageal and stomach trauma or perforation, bleeding, low blood pressure, aspiration, pneumonia, infection, trauma to the teeth and death.    After a procedural time-out, the oropharynx was anesthetized and the patient was sedated by the anesthesia service. The transesophageal probe was inserted in the esophagus and stomach without difficulty and multiple views were obtained. Anesthesia was monitored by Dr. Sampson Goon and Earlene Plater, CRNA.   COMPLICATIONS:    Complications: No complications Patient tolerated procedure well.  FINDINGS:  No LAA thrombus.  Severe systolic dysfunction   CARDIOVERSION:     Indications:  Symptomatic Atrial Fibrillation  Procedure Details:  Once the TEE was complete, the patient had the defibrillator pads placed in the anterior and posterior position. Once an appropriate level of sedation was confirmed, the patient was cardioverted x 1 with 200J of biphasic synchronized energy.  The patient converted to NSR.  There were no apparent complications.  The patient had normal neuro status and respiratory status post procedure with vitals stable as recorded elsewhere.  Adequate airway was maintained throughout and vital signs monitored per protocol.  Epifanio Lesches MD [facility]  [address], [address] [place], [place] [address] [phone]   12:19 PM

## 2022-10-17 NOTE — Progress Notes (Signed)
  Echocardiogram Echocardiogram Transesophageal has been performed.  Justin Perez 10/17/2022, 12:57 PM

## 2022-10-17 NOTE — Anesthesia Postprocedure Evaluation (Signed)
Anesthesia Post Note  Patient: Justin Perez  Procedure(s) Performed: TRANSESOPHAGEAL ECHOCARDIOGRAM CARDIOVERSION     Patient location during evaluation: Cath Lab Anesthesia Type: General Level of consciousness: awake and alert Pain management: pain level controlled Vital Signs Assessment: post-procedure vital signs reviewed and stable Respiratory status: spontaneous breathing, nonlabored ventilation and respiratory function stable Cardiovascular status: blood pressure returned to baseline and stable Postop Assessment: no apparent nausea or vomiting Anesthetic complications: no  No notable events documented.  Last Vitals:  Vitals:   10/17/22 1212 10/17/22 1225  BP: (!) 140/97 109/77  Pulse: 88 94  Resp: 19 15  Temp:  36.7 C  SpO2: 91% 92%    Last Pain:  Vitals:   10/17/22 1225  TempSrc: Temporal  PainSc: 0-No pain                 Dayan Desa,W. EDMOND

## 2022-10-17 NOTE — Interval H&P Note (Signed)
History and Physical Interval Note:  10/17/2022 11:06 AM  [name]  has presented today for surgery, with the diagnosis of AFIB.  The various methods of treatment have been discussed with the patient and family. After consideration of risks, benefits and other options for treatment, the patient has consented to  Procedure(s): TRANSESOPHAGEAL ECHOCARDIOGRAM (N/A) CARDIOVERSION (N/A) as a surgical intervention.  The patient's history has been reviewed, patient examined, no change in status, stable for surgery.  I have reviewed the patient's chart and labs.  Questions were answered to the patient's satisfaction.     Justin Perez

## 2022-10-17 NOTE — Transfer of Care (Signed)
Immediate Anesthesia Transfer of Care Note  Patient: Justin Perez  Procedure(s) Performed: TRANSESOPHAGEAL ECHOCARDIOGRAM CARDIOVERSION  Patient Location: Cath Lab  Anesthesia Type:MAC  Level of Consciousness: awake, oriented, drowsy, patient cooperative, and responds to stimulation  Airway & Oxygen Therapy: Patient Spontanous Breathing and Patient connected to face mask oxygen  Post-op Assessment: Report given to RN, Post -op Vital signs reviewed and stable, and Patient moving all extremities  Post vital signs: Reviewed and stable  Last Vitals:  Vitals Value Taken Time  BP 140/97 10/17/22 1211  Temp 36.6 C 10/17/22 1211  Pulse 95 10/17/22 1217  Resp 27 10/17/22 1217  SpO2 91 % 10/17/22 1217  Vitals shown include unfiled device data.  Last Pain:  Vitals:   10/17/22 1211  TempSrc: Temporal  PainSc: 0-No pain         Complications: No notable events documented.

## 2022-10-17 NOTE — Anesthesia Preprocedure Evaluation (Addendum)
Anesthesia Evaluation  Patient identified by MRN, date of birth, ID band Patient awake    Reviewed: Allergy & Precautions, H&P , NPO status , Patient's Chart, lab work & pertinent test results, reviewed documented beta blocker date and time   Airway Mallampati: III  TM Distance: >3 FB Neck ROM: Full    Dental no notable dental hx. (+) Teeth Intact, Dental Advisory Given   Pulmonary sleep apnea    Pulmonary exam normal breath sounds clear to auscultation       Cardiovascular hypertension, Pt. on medications and Pt. on home beta blockers +CHF  + dysrhythmias Atrial Fibrillation  Rhythm:Regular Rate:Normal     Neuro/Psych negative neurological ROS  negative psych ROS   GI/Hepatic negative GI ROS, Neg liver ROS,,,  Endo/Other    Morbid obesity  Renal/GU negative Renal ROS  negative genitourinary   Musculoskeletal   Abdominal   Peds  Hematology negative hematology ROS (+)   Anesthesia Other Findings   Reproductive/Obstetrics negative OB ROS                             Anesthesia Physical Anesthesia Plan  ASA: 3  Anesthesia Plan: General   Post-op Pain Management: Minimal or no pain anticipated   Induction: Intravenous  PONV Risk Score and Plan: 2 and Propofol infusion and Treatment may vary due to age or medical condition  Airway Management Planned: Natural Airway and Nasal Cannula  Additional Equipment:   Intra-op Plan:   Post-operative Plan:   Informed Consent: I have reviewed the patients History and Physical, chart, labs and discussed the procedure including the risks, benefits and alternatives for the proposed anesthesia with the patient or authorized representative who has indicated his/her understanding and acceptance.     Dental advisory given  Plan Discussed with: CRNA  Anesthesia Plan Comments:        Anesthesia Quick Evaluation

## 2022-10-21 ENCOUNTER — Encounter (HOSPITAL_COMMUNITY): Payer: Self-pay | Admitting: Cardiology

## 2022-10-23 ENCOUNTER — Telehealth: Payer: Self-pay | Admitting: Pharmacist

## 2022-10-23 ENCOUNTER — Ambulatory Visit (HOSPITAL_COMMUNITY)
Admission: RE | Admit: 2022-10-23 | Discharge: 2022-10-23 | Disposition: A | Discharge status: Home / Self Care | Payer: 59 | Source: Ambulatory Visit | Attending: Internal Medicine | Admitting: Internal Medicine

## 2022-10-23 VITALS — BP 132/102 | HR 66 | Ht 72.0 in | Wt >= 6400 oz

## 2022-10-23 DIAGNOSIS — I5082 Biventricular heart failure: Secondary | ICD-10-CM | POA: Insufficient documentation

## 2022-10-23 DIAGNOSIS — I5020 Unspecified systolic (congestive) heart failure: Secondary | ICD-10-CM

## 2022-10-23 DIAGNOSIS — I4819 Other persistent atrial fibrillation: Secondary | ICD-10-CM | POA: Insufficient documentation

## 2022-10-23 DIAGNOSIS — D6869 Other thrombophilia: Secondary | ICD-10-CM | POA: Diagnosis not present

## 2022-10-23 DIAGNOSIS — Z79899 Other long term (current) drug therapy: Secondary | ICD-10-CM | POA: Insufficient documentation

## 2022-10-23 DIAGNOSIS — I4891 Unspecified atrial fibrillation: Secondary | ICD-10-CM | POA: Diagnosis not present

## 2022-10-23 DIAGNOSIS — Z7901 Long term (current) use of anticoagulants: Secondary | ICD-10-CM | POA: Insufficient documentation

## 2022-10-23 DIAGNOSIS — I110 Hypertensive heart disease with heart failure: Secondary | ICD-10-CM | POA: Insufficient documentation

## 2022-10-23 NOTE — Telephone Encounter (Signed)
Medication list reviewed in anticipation of upcoming Tikosyn initiation. Patient is taking hydrochlorothiazide which is contraindicated with Tikosyn and will need to be discontinued at least 3 days prior to Tikosyn admit.  Patient is anticoagulated on Eliquis 5mg  BID on the appropriate dose. Please ensure that patient has not missed any anticoagulation doses in the 3 weeks prior to Tikosyn initiation.   Patient will need to be counseled to avoid use of Benadryl while on Tikosyn and in the 2-3 days prior to Tikosyn initiation.

## 2022-10-23 NOTE — Progress Notes (Signed)
Primary Care Physician: [facility] Primary Cardiologist: Olga Millers, MD Electrophysiologist: None     Referring Physician: [facility]     Justin Perez is a 47 y.o. male with a history of obesity, OSA, new systolic HF from echo TEE 10/17/22, HTN, and atrial fibrillation who presents for consultation in the [facility]. Symptoms of chest pain and SOB seen by PCP noted to be in Afib with RVR on 10/10/22. He was prescribed Lopressor 25 mg BID. Patient is on Eliquis for a CHADS2VASC score of 2.  On evaluation today 8/28, he is currently in Afib with RVR. He drinks 3 - 16 oz diet cokes daily. He drinks occasional alcohol. He does have sleep apnea but admits to sometimes falling asleep without CPAP mask on his face and will wake up gasping for air. He notes to have SOB and palpitations and lower extremity swelling and dizziness. He began full anticoagulation with Eliquis on 8/23.   On follow up 10/23/22, he is currently in NSR. He is s/p successful DCCV on 8/30. TEE with DCCV showed severely decreased biventricular systolic function. He is now compliant with CPAP. 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 He is trying to lose weight.  Today, he denies symptoms of chest pain, orthopnea, PND, presyncope, syncope, snoring, daytime somnolence, bleeding, or neurologic sequela. The patient is tolerating medications without difficulties and is otherwise without complaint today.    Atrial Fibrillation Risk Factors:  he does have symptoms or diagnosis of sleep apnea. he is compliant with CPAP therapy.  he has a BMI of Body mass index is 56.72 kg/m.Marland Kitchen Filed Weights   10/23/22 0952  Weight: (!) 189.7 kg     Current Outpatient Medications  Medication Sig Dispense Refill   acetaminophen (TYLENOL) 500 MG tablet Take 1,000 mg by mouth as needed for moderate pain.     ELIQUIS 5 MG TABS tablet Take 1 tablet  (5 mg total) by mouth 2 (two) times daily. 60 tablet 3   famotidine (PEPCID) 20 MG tablet Take 20 mg by mouth as needed for heartburn or indigestion.     hydrochlorothiazide (HYDRODIURIL) 12.5 MG tablet Take 12.5 mg by mouth daily.     losartan (COZAAR) 100 MG tablet Take 1 tablet (100 mg total) by mouth daily. 90 tablet 3   metoprolol tartrate (LOPRESSOR) 50 MG tablet Take 1 tablet (50 mg total) by mouth 2 (two) times daily. 60 tablet 2   pantoprazole (PROTONIX) 40 MG tablet Take 40 mg by mouth 2 (two) times daily.     No current facility-administered medications for this encounter.    Atrial Fibrillation Management history:  Previous antiarrhythmic drugs: None Previous cardioversions: 10/17/22 Previous ablations: None Anticoagulation history: Eliquis  ROS- All systems are reviewed and negative except as per the HPI above.  Physical Exam: BP (!) 132/102   Pulse 66   Ht 6' (1.829 m)   Wt (!) 189.7 kg   BMI 56.72 kg/m   GEN- The patient is well appearing, alert and oriented x 3 today. Morbidly obese habitus. Neck - no JVD or carotid bruit noted Lungs- Clear to ausculation bilaterally, normal work of breathing Heart- Regular rate and rhythm, no murmurs, rubs or gallops, PMI not laterally displaced Extremities- no clubbing, cyanosis, or edema Skin - no rash or ecchymosis noted   EKG today demonstrates  Vent. rate 66 BPM PR interval 168 ms QRS duration 98 ms QT/QTcB  408/427 ms P-R-T axes 5 -64 60 Normal sinus rhythm Left axis deviation Anterior infarct , age undetermined Abnormal ECG When compared with ECG of 17-Oct-2022 12:09, PREVIOUS ECG IS PRESENT  Echo 10/17/22 TEE: 1. Left ventricular ejection fraction, by estimation, is 20 to 25%. The  left ventricle has severely decreased function.   2. Right ventricular systolic function is severely reduced. The right  ventricular size is moderately enlarged.   3. Left atrial size was severely dilated. No left atrial/left  atrial  appendage thrombus was detected. The LAA emptying velocity was 43 cm/s.   4. Right atrial size was moderately dilated.   5. The mitral valve is normal in structure. Mild mitral valve  regurgitation. No evidence of mitral stenosis.   6. Tricuspid valve regurgitation is mild to moderate.   7. The aortic valve is tricuspid. Aortic valve regurgitation is not  visualized. No aortic stenosis is present.    ASSESSMENT & PLAN CHA2DS2-VASc Score = 2 The patient's score is based upon: CHF History: 1 HTN History: 1 Diabetes History: 0 Stroke History: 0 Vascular Disease History: 0 Age Score: 0 Gender Score: 0       ASSESSMENT AND PLAN: Persistent Atrial Fibrillation (ICD10:  I48.0) The patient's CHA2DS2-VASc score is 2, indicating a 2.2% annual risk of stroke.    He is currently in NSR. Discussion with patient regarding new finding of severe biventricular HF. 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 He is amenable to this and also agreeable to HF clinic referral for assistance in managing this. He is working on weight loss which is great. Continue monitoring rhythm with [employer]. He will speak to wife and call insurance about pricing. He will call to confirm admission for Tikosyn. He will have to stop hydrochlorothiazide at least 3 days prior to admission.   Tikosyn criteria: CrCl 247 mL/min  Qtc in NSR 435 ms.  Secondary hypercoagulable state due to atrial fibrillation - Chads 2 Continue Eliquis without interruption.    Patient will call to schedule Tikosyn admission.   Lake Bells, PA-C  Afib Clinic [facility] [address] [place], [place] [address] [phone]

## 2022-10-23 NOTE — Telephone Encounter (Signed)
Patient notified to stop hydrochlorothiazide 3 days prior to admission.

## 2022-10-24 ENCOUNTER — Telehealth (HOSPITAL_COMMUNITY): Payer: Self-pay

## 2022-10-24 NOTE — Telephone Encounter (Signed)
Initiated prior authorization for Tikosyn admission to [facility]. Date of service: 11/10/2022   Authorization is pending for review.  Faxed clinical information to [phone] Pending authorization # [id number] Call reference # [id number]

## 2022-10-31 NOTE — Telephone Encounter (Signed)
Tikosyn admission for date of service: 11/10/22 has been approved. Authorization # [id number]

## 2022-11-07 ENCOUNTER — Encounter (HOSPITAL_COMMUNITY): Payer: Self-pay

## 2022-11-10 ENCOUNTER — Ambulatory Visit (HOSPITAL_COMMUNITY)
Admission: RE | Admit: 2022-11-10 | Discharge: 2022-11-10 | Disposition: A | Discharge status: Home / Self Care | Payer: 59 | Source: Ambulatory Visit | Attending: Internal Medicine | Admitting: Internal Medicine

## 2022-11-10 ENCOUNTER — Other Ambulatory Visit: Payer: Self-pay

## 2022-11-10 ENCOUNTER — Other Ambulatory Visit (HOSPITAL_COMMUNITY): Payer: Self-pay

## 2022-11-10 ENCOUNTER — Encounter (HOSPITAL_COMMUNITY): Payer: Self-pay | Admitting: Cardiology

## 2022-11-10 ENCOUNTER — Inpatient Hospital Stay (HOSPITAL_COMMUNITY)
Admission: RE | Admit: 2022-11-10 | Discharge: 2022-11-13 | DRG: 309 | Disposition: A | Discharge status: Home / Self Care | Payer: 59 | Source: Ambulatory Visit | Attending: Cardiology | Admitting: Cardiology

## 2022-11-10 VITALS — BP 132/88 | HR 52 | Ht 72.0 in | Wt >= 6400 oz

## 2022-11-10 DIAGNOSIS — G4733 Obstructive sleep apnea (adult) (pediatric): Secondary | ICD-10-CM | POA: Diagnosis present

## 2022-11-10 DIAGNOSIS — E669 Obesity, unspecified: Secondary | ICD-10-CM | POA: Diagnosis present

## 2022-11-10 DIAGNOSIS — G4730 Sleep apnea, unspecified: Secondary | ICD-10-CM | POA: Diagnosis present

## 2022-11-10 DIAGNOSIS — D6869 Other thrombophilia: Secondary | ICD-10-CM | POA: Diagnosis present

## 2022-11-10 DIAGNOSIS — Z8673 Personal history of transient ischemic attack (TIA), and cerebral infarction without residual deficits: Secondary | ICD-10-CM

## 2022-11-10 DIAGNOSIS — I5042 Chronic combined systolic (congestive) and diastolic (congestive) heart failure: Secondary | ICD-10-CM | POA: Diagnosis present

## 2022-11-10 DIAGNOSIS — I5022 Chronic systolic (congestive) heart failure: Secondary | ICD-10-CM | POA: Diagnosis not present

## 2022-11-10 DIAGNOSIS — Z79899 Other long term (current) drug therapy: Secondary | ICD-10-CM

## 2022-11-10 DIAGNOSIS — I472 Ventricular tachycardia, unspecified: Secondary | ICD-10-CM | POA: Diagnosis not present

## 2022-11-10 DIAGNOSIS — I110 Hypertensive heart disease with heart failure: Secondary | ICD-10-CM | POA: Diagnosis present

## 2022-11-10 DIAGNOSIS — I480 Paroxysmal atrial fibrillation: Secondary | ICD-10-CM | POA: Diagnosis present

## 2022-11-10 DIAGNOSIS — Z7901 Long term (current) use of anticoagulants: Secondary | ICD-10-CM | POA: Diagnosis not present

## 2022-11-10 DIAGNOSIS — Z6841 Body Mass Index (BMI) 40.0 and over, adult: Secondary | ICD-10-CM

## 2022-11-10 DIAGNOSIS — R001 Bradycardia, unspecified: Secondary | ICD-10-CM | POA: Diagnosis present

## 2022-11-10 DIAGNOSIS — I4819 Other persistent atrial fibrillation: Secondary | ICD-10-CM | POA: Diagnosis not present

## 2022-11-10 DIAGNOSIS — E119 Type 2 diabetes mellitus without complications: Secondary | ICD-10-CM | POA: Diagnosis present

## 2022-11-10 DIAGNOSIS — Z23 Encounter for immunization: Secondary | ICD-10-CM | POA: Diagnosis not present

## 2022-11-10 DIAGNOSIS — I428 Other cardiomyopathies: Secondary | ICD-10-CM | POA: Diagnosis present

## 2022-11-10 DIAGNOSIS — I872 Venous insufficiency (chronic) (peripheral): Secondary | ICD-10-CM | POA: Diagnosis present

## 2022-11-10 LAB — MAGNESIUM: Magnesium: 1.9 mg/dL (ref 1.7–2.4)

## 2022-11-10 LAB — BASIC METABOLIC PANEL
Anion gap: 7 (ref 5–15)
BUN: 9 mg/dL (ref 6–20)
CO2: 29 mmol/L (ref 22–32)
Calcium: 8.9 mg/dL (ref 8.9–10.3)
Chloride: 104 mmol/L (ref 98–111)
Creatinine, Ser: 1 mg/dL (ref 0.61–1.24)
GFR, Estimated: >60 mL/min (ref >60)
Glucose, Bld: 92 mg/dL (ref 70–99)
Potassium: 4.4 mmol/L (ref 3.5–5.1)
Sodium: 140 mmol/L (ref 135–145)

## 2022-11-10 LAB — HIV ANTIBODY (ROUTINE TESTING W REFLEX): HIV Screen 4th Generation wRfx: NONREACTIVE

## 2022-11-10 MED ORDER — DOFETILIDE 500 MCG PO CAPS
500.0000 ug | ORAL_CAPSULE | Freq: Two times a day (BID) | ORAL | Status: DC
  Administered 2022-11-10 – 2022-11-13 (×6): 500 ug via ORAL
  Filled 2022-11-10 (×6): qty 1

## 2022-11-10 MED ORDER — APIXABAN 5 MG PO TABS
5.0000 mg | ORAL_TABLET | Freq: Two times a day (BID) | ORAL | Status: DC
  Administered 2022-11-10 – 2022-11-13 (×6): 5 mg via ORAL
  Filled 2022-11-10 (×6): qty 1

## 2022-11-10 MED ORDER — METOPROLOL SUCCINATE ER 50 MG PO TB24
50.0000 mg | ORAL_TABLET | Freq: Two times a day (BID) | ORAL | Status: DC
  Administered 2022-11-11: 50 mg via ORAL
  Filled 2022-11-10 (×2): qty 1

## 2022-11-10 MED ORDER — MAGNESIUM SULFATE 2 GM/50ML IV SOLN
2.0000 g | Freq: Once | INTRAVENOUS | Status: AC
  Administered 2022-11-10: 2 g via INTRAVENOUS
  Filled 2022-11-10: qty 50

## 2022-11-10 MED ORDER — SODIUM CHLORIDE 0.9% FLUSH
3.0000 mL | Freq: Two times a day (BID) | INTRAVENOUS | Status: DC
  Administered 2022-11-10 – 2022-11-13 (×6): 3 mL via INTRAVENOUS

## 2022-11-10 MED ORDER — SODIUM CHLORIDE 0.9 % IV SOLN
250.0000 mL | INTRAVENOUS | Status: DC | PRN
  Administered 2022-11-11: 250 mL via INTRAVENOUS

## 2022-11-10 MED ORDER — DOFETILIDE 500 MCG PO CAPS: 500.0000 ug | ORAL_CAPSULE | Freq: Two times a day (BID) | ORAL | Status: DC

## 2022-11-10 MED ORDER — LOSARTAN POTASSIUM 50 MG PO TABS
100.0000 mg | ORAL_TABLET | Freq: Every day | ORAL | Status: DC
  Administered 2022-11-11: 100 mg via ORAL
  Filled 2022-11-10: qty 2

## 2022-11-10 MED ORDER — SODIUM CHLORIDE 0.9% FLUSH: 3.0000 mL | INTRAVENOUS | Status: DC | PRN

## 2022-11-10 MED ORDER — METOPROLOL TARTRATE 50 MG PO TABS: 50.0000 mg | ORAL_TABLET | Freq: Two times a day (BID) | ORAL | Status: DC

## 2022-11-10 MED ORDER — INFLUENZA VIRUS VACC SPLIT PF (FLUZONE) 0.5 ML IM SUSY
0.5000 mL | PREFILLED_SYRINGE | INTRAMUSCULAR | Status: AC
  Administered 2022-11-13: 0.5 mL via INTRAMUSCULAR

## 2022-11-10 NOTE — Progress Notes (Signed)
Pharmacy: Dofetilide (Tikosyn) - Initial Consult Assessment and Electrolyte Replacement  Pharmacy consulted to assist in monitoring and replacing electrolytes in this 47 y.o. male admitted on 11/10/2022 undergoing dofetilide initiation. First dofetilide dose: 11/10/22 PM  Assessment:  Patient Exclusion Criteria: If any screening criteria checked as "Yes", then  patient  should NOT receive dofetilide until criteria item is corrected.  If "Yes" please indicate correction plan.  YES  NO Patient  Exclusion Criteria Correction Plan   []   [x]   Baseline QTc interval is greater than or equal to 440 msec. IF above YES box checked dofetilide contraindicated unless patient has ICD; then may proceed if QTc 500-550 msec or with known ventricular conduction abnormalities may proceed with QTc 550-600 msec. QTc = 427 msec (per Afib clinic note 9/23)    []   [x]   Patient is known or suspected to have a digoxin level greater than 2 ng/ml: No results found for: "DIGOXIN"     []   [x]   Creatinine clearance less than 20 ml/min (calculated using Cockcroft-Gault, actual body weight and serum creatinine): Estimated Creatinine Clearance: 157.5 mL/min (by C-G formula based on SCr of 1 mg/dL). CrCl using actual BW = 243 mL/min    []   [x]  Patient has received drugs known to prolong the QT intervals within the last 48 hours (phenothiazines, tricyclics or tetracyclic antidepressants, erythromycin, H-1 antihistamines, cisapride, fluoroquinolones, azithromycin, ondansetron).   Updated information on QT prolonging agents is available to be searched on the following database:QT prolonging agents     []   [x]   Patient received a dose of hydrochlorothiazide (Oretic) alone or in any combination including triamterene (Dyazide, Maxzide) in the last 48 hours. Last dose Thurs 9/19   []   [x]  Patient received a medication known to increase dofetilide plasma concentrations prior to initial dofetilide dose:  Trimethoprim  (Primsol, Proloprim) in the last 36 hours Verapamil (Calan, Verelan) in the last 36 hours or a sustained release dose in the last 72 hours Megestrol (Megace) in the last 5 days  Cimetidine (Tagamet) in the last 6 hours Ketoconazole (Nizoral) in the last 24 hours Itraconazole (Sporanox) in the last 48 hours  Prochlorperazine (Compazine) in the last 36 hours     []   [x]   Patient is known to have a history of torsades de pointes; congenital or acquired long QT syndromes.    []   [x]   Patient has received a Class 1 antiarrhythmic with less than 2 half-lives since last dose. (Disopyramide, Quinidine, Procainamide, Lidocaine, Mexiletine, Flecainide, Propafenone)    []   [x]   Patient has received amiodarone therapy in the past 3 months or amiodarone level is greater than 0.3 ng/ml.    Labs:    Component Value Date/Time   K 4.4 11/10/2022 1150   MG 1.9 11/10/2022 1150     Plan: Select One Calculated CrCl  Dose q12h  [x]  > 60 ml/min 500 mcg  []  40-60 ml/min 250 mcg  []  20-40 ml/min 125 mcg   [x]   Physician selected initial dose within range recommended for patients level of renal function - will monitor for response.  []   Physician selected initial dose outside of range recommended for patients level of renal function - will discuss if the dose should be altered at this time.   Patient has been appropriately anticoagulated with uninterrupted Eliquis x3wk.  Potassium: K >/= 4: Appropriate to initiate Tikosyn, no replacement needed    Magnesium: Mg 1.8-2: Give Mg 2 gm IV x1 to prevent Mg from dropping below 1.8 -  do not need to recheck Mg. Appropriate to initiate Tikosyn   Thank you for allowing pharmacy to participate in this patient's care   Trixie Rude, PharmD Clinical Pharmacist 11/10/2022  1:59 PM

## 2022-11-10 NOTE — Progress Notes (Signed)
Primary Care Physician: [facility] Primary Cardiologist: Olga Millers, MD Electrophysiologist: None     Referring Physician: [name]     Justin Perez is a 47 y.o. male with a history of obesity, OSA, new systolic HF from echo TEE 10/17/22, HTN, and atrial fibrillation who presents for consultation in the [facility] Atrial Fibrillation [facility]. Symptoms of chest pain and SOB seen by PCP noted to be in Afib with RVR on 10/10/22. He was prescribed Lopressor 25 mg BID. Patient is on Eliquis for a CHADS2VASC score of 2.  On evaluation today 8/28, he is currently in Afib with RVR. He drinks 3 - 16 oz diet cokes daily. He drinks occasional alcohol. He does have sleep apnea but admits to sometimes falling asleep without CPAP mask on his face and will wake up gasping for air. He notes to have SOB and palpitations and lower extremity swelling and dizziness. He began full anticoagulation with Eliquis on 8/23.   On follow up 10/23/22, he is currently in NSR. He is s/p successful DCCV on 8/30. TEE with DCCV showed severely decreased biventricular systolic function. He is now compliant with CPAP. 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 He is trying to lose weight.  On follow up 11/10/22, patient is here for Tikosyn admission. No new medications since last OV. He stopped hydrochlorothiazide over 3 days prior to today's planned admission. No benadryl use. No missed doses of anticoagulant.   Today, he denies symptoms of chest pain, orthopnea, PND, presyncope, syncope, snoring, daytime somnolence, bleeding, or neurologic sequela. The patient is tolerating medications without difficulties and is otherwise without complaint today.    Atrial Fibrillation Risk Factors:  he does have symptoms or diagnosis of sleep apnea. he is compliant with CPAP therapy.  he has a BMI of Body mass index is 56.31 kg/m.Marland Kitchen Filed Weights   11/10/22 0947   Weight: (!) 188.3 kg     Current Outpatient Medications  Medication Sig Dispense Refill   acetaminophen (TYLENOL) 500 MG tablet Take 1,000 mg by mouth as needed for moderate pain.     ELIQUIS 5 MG TABS tablet Take 1 tablet (5 mg total) by mouth 2 (two) times daily. 60 tablet 3   famotidine (PEPCID) 20 MG tablet Take 20 mg by mouth as needed for heartburn or indigestion.     losartan (COZAAR) 100 MG tablet Take 1 tablet (100 mg total) by mouth daily. 90 tablet 3   metoprolol tartrate (LOPRESSOR) 50 MG tablet Take 1 tablet (50 mg total) by mouth 2 (two) times daily. 60 tablet 2   pantoprazole (PROTONIX) 40 MG tablet Take 40 mg by mouth 2 (two) times daily.     No current facility-administered medications for this encounter.    Atrial Fibrillation Management history:  Previous antiarrhythmic drugs: None Previous cardioversions: 10/17/22 Previous ablations: None Anticoagulation history: Eliquis  ROS- All systems are reviewed and negative except as per the HPI above.  Physical Exam: BP 132/88   Pulse (!) 52   Ht 6' (1.829 m)   Wt (!) 188.3 kg   BMI 56.31 kg/m   GEN- The patient is well appearing, alert and oriented x 3 today.   Neck - no JVD or carotid bruit noted Lungs- Clear to ausculation bilaterally, normal work of breathing Heart- Regular bradycardic rate and rhythm, no murmurs, rubs or gallops, PMI not laterally displaced Extremities- no clubbing, cyanosis, or edema Skin - no rash  or ecchymosis noted  EKG today demonstrates  Vent. rate 52 BPM PR interval 158 ms QRS duration 96 ms QT/QTcB 390/362 ms P-R-T axes 8 -60 7 Sinus bradycardia Left axis deviation Anterior infarct , age undetermined Abnormal ECG When compared with ECG of 23-Oct-2022 10:02, PREVIOUS ECG IS PRESENT  Echo 10/17/22 TEE: 1. Left ventricular ejection fraction, by estimation, is 20 to 25%. The  left ventricle has severely decreased function.   2. Right ventricular systolic function is severely  reduced. The right  ventricular size is moderately enlarged.   3. Left atrial size was severely dilated. No left atrial/left atrial  appendage thrombus was detected. The LAA emptying velocity was 43 cm/s.   4. Right atrial size was moderately dilated.   5. The mitral valve is normal in structure. Mild mitral valve  regurgitation. No evidence of mitral stenosis.   6. Tricuspid valve regurgitation is mild to moderate.   7. The aortic valve is tricuspid. Aortic valve regurgitation is not  visualized. No aortic stenosis is present.    ASSESSMENT & PLAN CHA2DS2-VASc Score = 2 The patient's score is based upon: CHF History: 1 HTN History: 1 Diabetes History: 0 Stroke History: 0 Vascular Disease History: 0 Age Score: 0 Gender Score: 0      ASSESSMENT AND PLAN: Persistent Atrial Fibrillation (ICD10:  I48.0) The patient's CHA2DS2-VASc score is 2, indicating a 2.2% annual risk of stroke.    Patient presents for dofetilide admission. Continue Eliquis, states no missed doses in the last 3 weeks. No recent benadryl use. PharmD has screened medications. He stopped hydrochlorothiazide over 3 days prior to admission. QTc in SR 427 ms Labs currently pending - previous estimate of CrCl was 247 mL/min.    Secondary hypercoagulable state due to atrial fibrillation - Chads 2 Continue Eliquis without interruption.    Patient will present to admissions and appears to have room 6E23 reserved.   Lake Bells, PA-C  Afib Clinic [facility] [address] [place], [place] [address] [phone]

## 2022-11-10 NOTE — TOC CM/SW Note (Signed)
Transition of Care [facility]) - Inpatient Brief Assessment   Patient Details  Name: Justin Perez MRN: [id number] Date of Birth: [birth year]  Transition of Care [facility]) CM/SW Contact:    Gala Lewandowsky, RN Phone Number: 11/10/2022, 11:40 AM   Clinical Narrative: Transition of Care Department [facility]) has reviewed the patient. Patient presented for Tikosyn Load. Benefits check submitted for cost. Case Manager will discuss cost and pharmacy of choice as the patient progresses.  Transition of Care Asessment: Insurance and Status: Insurance coverage has been reviewed Prior/Current Home Services: No current home services Social Determinants of Health Reivew: SDOH reviewed no interventions necessary Readmission risk has been reviewed: Yes Transition of care needs: no transition of care needs at this time

## 2022-11-10 NOTE — TOC Benefit Eligibility Note (Signed)
Patient Product/process development scientist completed.    The patient is insured through [facility]. Patient has [employer], may use a copay card, and/or apply for patient assistance if available.    Ran test claim for dofetilide (Tikosyn) 500 mcg  and the current 30 day co-pay is $11.53.   This test claim was processed through [facility]- copay amounts may vary at other pharmacies due to pharmacy/plan contracts, or as the patient moves through the different stages of their insurance plan.     Roland Earl, CPHT Pharmacy Technician III Certified Patient Advocate [facility] Pharmacy Patient Advocate Team Direct Number: [phone]  Fax: [phone]

## 2022-11-10 NOTE — H&P (Addendum)
Electrophysiology H&P  Note    Primary Care Physician: Justin Perez, Justin Perez Primary Cardiologist: Justin Millers, MD Electrophysiologist: New    Referring Physician: [facility]    Justin Perez is a 47 y.o. male with a history of obesity, OSA, new systolic HF from echo TEE 10/17/22, HTN, and atrial fibrillation who presents for consultation in the Justin Perez. Symptoms of chest pain and SOB seen by PCP noted to be in Afib with RVR on 10/10/22. He was prescribed Lopressor 25 mg BID. Patient is on Eliquis for a CHADS2VASC score of 2.  On evaluation today 8/28, he is currently in Afib with RVR. He drinks 3 - 16 oz diet cokes daily. He drinks occasional alcohol. He does have sleep apnea but admits to sometimes falling asleep without CPAP mask on his face and will wake up gasping for air. He notes to have SOB and palpitations and lower extremity swelling and dizziness. He began full anticoagulation with Eliquis on 8/23.   On follow up 10/23/22, he is currently in NSR. He is s/p successful DCCV on 8/30. TEE with DCCV showed severely decreased biventricular systolic function. He is now compliant with CPAP. 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 He is trying to lose weight.  On follow up 11/10/22, patient is here for Tikosyn admission. No new medications since last OV. He stopped hydrochlorothiazide over 3 days prior to today's planned admission. No benadryl use. No missed doses of anticoagulant.   Today, he denies symptoms of chest pain, orthopnea, PND, presyncope, syncope, snoring, daytime somnolence, bleeding, or neurologic sequela. The patient is tolerating medications without difficulties and is otherwise without complaint today.    Atrial Fibrillation Risk Factors:  he does have symptoms or diagnosis of sleep apnea. he is compliant with CPAP therapy.  he has a BMI of Body mass index is 56.31 kg/m.Marland Kitchen     Filed Weights    11/10/22 0947  Weight: (!) 188.3 kg      No current facility-administered medications for this encounter.    Atrial Fibrillation Management history:  Previous antiarrhythmic drugs: None Previous cardioversions: 10/17/22 Previous ablations: None Anticoagulation history: Eliquis  ROS- All systems are reviewed and negative except as per the HPI above.  Physical Exam BP 132/88  Pulse (!) 52  Ht 6' (1.829 m)  Wt (!) 188.3 kg  BMI 56.31 kg/m   GEN- The patient is well appearing, alert and oriented x 3 today.   Neck - no JVD or carotid bruit noted Lungs- Clear to ausculation bilaterally, normal work of breathing Heart- Regular bradycardic rate and rhythm, no murmurs, rubs or gallops, PMI not laterally displaced Extremities- no clubbing, cyanosis, or edema Skin - no rash or ecchymosis noted  EKG today demonstrates  Vent. rate 52 BPM PR interval 158 ms QRS duration 96 ms QT/QTcB 390/362 ms P-R-T axes 8 -60 7 Sinus bradycardia Left axis deviation Anterior infarct , age undetermined Abnormal ECG When compared with ECG of 23-Oct-2022 10:02, PREVIOUS ECG IS PRESENT  Echo 10/17/22 TEE: 1. Left ventricular ejection fraction, by estimation, is 20 to 25%. The  left ventricle has severely decreased function.   2. Right ventricular systolic function is severely reduced. The right  ventricular size is moderately enlarged.   3. Left atrial size was severely dilated. No left atrial/left atrial  appendage thrombus was detected. The LAA emptying velocity was 43 cm/s.   4. Right atrial size was moderately dilated.  5. The mitral valve is normal in structure. Mild mitral valve  regurgitation. No evidence of mitral stenosis.   6. Tricuspid valve regurgitation is mild to moderate.   7. The aortic valve is tricuspid. Aortic valve regurgitation is not  visualized. No aortic stenosis is present.    ASSESSMENT & PLAN CHA2DS2-VASc Score = 2 The patient's score is  based upon: CHF History: 1 HTN History: 1 Diabetes History: 0 Stroke History: 0 Vascular Disease History: 0 Age Score: 0 Gender Score: 0      ASSESSMENT AND PLAN: Persistent Atrial Fibrillation (ICD10:  I48.0) The patient's CHA2DS2-VASc score is 2, indicating a 2.2% annual risk of stroke.    Patient presents for dofetilide admission. Continue Eliquis, states no missed doses in the last 3 weeks. No recent benadryl use. PharmD has screened medications. He stopped hydrochlorothiazide over 3 days prior to admission. QTc in SR 427 ms Labs currently pending - previous estimate of CrCl was 247 mL/min.  Not candidate for ablation given BMI.   Secondary hypercoagulable state due to atrial fibrillation  Continue Eliquis without interruption.    Chronic systolic CHF Repeat limited this admit while in sinus Presumed NICM - tachy Will ask HF TOC to follow for visit at discharge.   Patient presents for tikosyn load as above.   Justin Needle [address] [name], PA-C  11/10/2022 11:00 AM

## 2022-11-11 ENCOUNTER — Other Ambulatory Visit (HOSPITAL_COMMUNITY): Payer: Self-pay

## 2022-11-11 ENCOUNTER — Inpatient Hospital Stay (HOSPITAL_COMMUNITY): Payer: 59

## 2022-11-11 ENCOUNTER — Encounter (HOSPITAL_COMMUNITY): Payer: Self-pay | Admitting: Cardiology

## 2022-11-11 DIAGNOSIS — I5022 Chronic systolic (congestive) heart failure: Secondary | ICD-10-CM | POA: Diagnosis not present

## 2022-11-11 DIAGNOSIS — I4819 Other persistent atrial fibrillation: Secondary | ICD-10-CM | POA: Diagnosis not present

## 2022-11-11 LAB — ECHOCARDIOGRAM LIMITED
Height: 72 in
S' Lateral: 5.5 cm
Weight: 6643.2 oz

## 2022-11-11 LAB — BASIC METABOLIC PANEL
Anion gap: 10 (ref 5–15)
BUN: 10 mg/dL (ref 6–20)
CO2: 26 mmol/L (ref 22–32)
Calcium: 9.1 mg/dL (ref 8.9–10.3)
Chloride: 102 mmol/L (ref 98–111)
Creatinine, Ser: 1.04 mg/dL (ref 0.61–1.24)
GFR, Estimated: >60 mL/min (ref >60)
Glucose, Bld: 98 mg/dL (ref 70–99)
Potassium: 3.6 mmol/L (ref 3.5–5.1)
Sodium: 138 mmol/L (ref 135–145)

## 2022-11-11 LAB — MAGNESIUM: Magnesium: 2 mg/dL (ref 1.7–2.4)

## 2022-11-11 MED ORDER — METOPROLOL SUCCINATE ER 25 MG PO TB24: 25.0000 mg | ORAL_TABLET | Freq: Two times a day (BID) | ORAL | Status: DC

## 2022-11-11 MED ORDER — METOPROLOL SUCCINATE ER 25 MG PO TB24
25.0000 mg | ORAL_TABLET | Freq: Every day | ORAL | Status: DC
  Administered 2022-11-12 – 2022-11-13 (×2): 25 mg via ORAL
  Filled 2022-11-11 (×2): qty 1

## 2022-11-11 MED ORDER — LOSARTAN POTASSIUM 50 MG PO TABS
100.0000 mg | ORAL_TABLET | Freq: Every day | ORAL | Status: DC
  Administered 2022-11-12 – 2022-11-13 (×2): 100 mg via ORAL
  Filled 2022-11-11 (×2): qty 2

## 2022-11-11 MED ORDER — SACUBITRIL-VALSARTAN 49-51 MG PO TABS: 1.0000 | ORAL_TABLET | Freq: Two times a day (BID) | ORAL | Status: DC

## 2022-11-11 MED ORDER — MAGNESIUM SULFATE 2 GM/50ML IV SOLN
2.0000 g | Freq: Once | INTRAVENOUS | Status: AC
  Administered 2022-11-11: 2 g via INTRAVENOUS
  Filled 2022-11-11: qty 50

## 2022-11-11 MED ORDER — PERFLUTREN LIPID MICROSPHERE
1.0000 mL | INTRAVENOUS | Status: AC | PRN
  Administered 2022-11-11: 5 mL via INTRAVENOUS

## 2022-11-11 MED ORDER — POTASSIUM CHLORIDE CRYS ER 20 MEQ PO TBCR
60.0000 meq | EXTENDED_RELEASE_TABLET | Freq: Once | ORAL | Status: AC
  Administered 2022-11-11: 60 meq via ORAL
  Filled 2022-11-11: qty 3

## 2022-11-11 NOTE — Progress Notes (Signed)
Morning EKG reviewed     Shows remains in NSR with stable QTc at ~440-450 ms.  Continue  Tikosyn 500 mcg BID.   Potassium3.6 (09/24 0327) Magnesium  2.0 (09/24 0327) Creatinine, ser  1.04 (09/24 0327)  Plan for home Thursday if QTc remains stable   Graciella Freer, [place]  11/11/2022 11:03 AM

## 2022-11-11 NOTE — Progress Notes (Signed)
Pharmacy: Dofetilide (Tikosyn) - Follow Up Assessment and Electrolyte Replacement  Pharmacy consulted to assist in monitoring and replacing electrolytes in this 47 y.o. male admitted on 11/10/2022 undergoing dofetilide initiation. First dofetilide dose: 11/10/22 PM  Labs:    Component Value Date/Time   K 3.6 11/11/2022 0327   MG 2.0 11/11/2022 0327     Plan: Potassium: K 3.5-3.7:  Give KCl 60 mEq po x1   Magnesium: Mg 1.8-2: Give Mg 2 gm IV x1    Thank you for allowing pharmacy to participate in this patient's care   Trixie Rude, PharmD Clinical Pharmacist 11/11/2022  7:06 AM

## 2022-11-11 NOTE — Progress Notes (Signed)
   Electrophysiology Rounding Note  Patient Name: Justin Perez Date of Encounter: 11/11/2022  Primary Cardiologist: Olga Millers, MD  Electrophysiologist: Dr. Jimmey Ralph   Subjective   Pt remains in NSR on Tikosyn 500 mcg BID   QTc from EKG last pm shows stable QTc at ~420 ms  The patient is doing well today.  At this time, the patient denies chest pain, shortness of breath, or any new concerns.  Inpatient Medications    Scheduled Meds:  apixaban  5 mg Oral BID   dofetilide  500 mcg Oral BID   influenza vac split trivalent PF  0.5 mL Intramuscular Tomorrow-1000   losartan  100 mg Oral Daily   metoprolol succinate  50 mg Oral BID   potassium chloride  60 mEq Oral Once   sodium chloride flush  3 mL Intravenous Q12H   Continuous Infusions:  sodium chloride     magnesium sulfate bolus IVPB     PRN Meds: sodium chloride, sodium chloride flush   Vital Signs    Vitals:   11/11/22 0002 11/11/22 0404 11/11/22 0700 11/11/22 0756  BP: 126/65 117/67  117/77  Pulse: (!) 105 (!) 58    Resp: 18 16  18   Temp: 97.7 F (36.5 C) 97.6 F (36.4 C)  97.9 F (36.6 C)  TempSrc: Oral Oral Oral Oral  SpO2: (!) 88% 91%      Intake/Output Summary (Last 24 hours) at 11/11/2022 0813 Last data filed at 11/10/2022 1735 Gross per 24 hour  Intake 18.44 ml  Output --  Net 18.44 ml   There were no vitals filed for this visit.  Physical Exam    GEN- NAD, A&O x 3. Normal affect.  Lungs- CTAB, Normal effort.  Heart- Regular rate and rhythm. No M/G/R GI- Soft, NT, ND Extremities- No clubbing, cyanosis, or edema Skin- no rash or lesion  Labs    CBC No results for input(s): "WBC", "NEUTROABS", "HGB", "HCT", "MCV", "PLT" in the last 72 hours. Basic Metabolic Panel Recent Labs    11/91/47 1150 11/11/22 0327  NA 140 138  K 4.4 3.6  CL 104 102  CO2 29 26  GLUCOSE 92 98  BUN 9 10  CREATININE 1.00 1.04  CALCIUM 8.9 9.1  MG 1.9 2.0    Telemetry    Sinus bradycardia 50s  (personally reviewed)  Patient Profile     Justin Perez is a 47 y.o. male with a past medical history significant for persistent atrial fibrillation.  They were admitted for tikosyn load.   Assessment & Plan    Persistent atrial fibrillation Pt remains in NSR on Tikosyn 500 mcg BID  Continue Eliquis Creatinine, ser  1.04 (09/24 0327) Magnesium  2.0 (09/24 0327) Potassium3.6 (09/24 0327) Supplement K  Obesity Encouraged lifestyle modification  Will need weight loss to be ablation candidate.   For questions or updates, please contact [facility] Please consult [email or web address] for contact info under Cardiology/STEMI.  Signed, Graciella Freer, PA-C  11/11/2022, 8:13 AM

## 2022-11-11 NOTE — Progress Notes (Addendum)
   Heart Failure Stewardship Pharmacist Progress Note  Received notification from [facility] that prior authorization for Sherryll Burger is required.   PA submitted on CoverMyMeds Key [id number] Status is approved through 11/11/23. Copay $60. Copay card lowers to $10.    Sharen Hones, PharmD, BCPS Heart Failure Stewardship Pharmacist Phone [phone]

## 2022-11-11 NOTE — Progress Notes (Signed)
At 2255 pt had 20 bts Non sustained VT. BP 142/78, Pt denies CP/SOB. Provider on call Dr Elayne Guerin notified via Loretha Stapler will continue to monitor.

## 2022-11-11 NOTE — Progress Notes (Signed)
   Heart Failure Stewardship Pharmacist Progress Note   PCP: Pa, [facility] PCP-Cardiologist: Olga Millers, MD    HPI:  47 yo M with PMH of obesity, OSA, HTN, afib, and new systolic CHF.   He had a PCP visit on 8/23 where he was noted to be in afib RVR. He was started on metoprolol and Eliquis. Seen in afib clinic on 8/28, he was still in afib RVR. Underwent successful DCCV on 8/30. EF was found to be newly reduced to 20-25% (was 55-60% in 2020). Seen for follow up on 9/5 and was maintaining NSR. He was agreeable to starting Tikosyn for rhythm control. Admitted on 9/23 to initiate Tikosyn. Repeat ECHO pending.   Current HF Medications: Beta Blocker: metoprolol XL 25 mg BID ACE/ARB/ARNI: losartan 100 mg daily  Prior to admission HF Medications: Beta blocker: metoprolol tartrate 50 mg BID ACE/ARB/ARNI: losartan 100 mg daily  Pertinent Lab Values: Serum creatinine 1.04, BUN 10, Potassium 3.6, Sodium 138, Magnesium 2.0  Vital Signs: Weight: 415 lbs (admission weight: 415 lbs) Blood pressure: 110-130/70s  Heart rate: 40-50s  I/O: incomplete  Medication Assistance / Insurance Benefits Check: Does the patient have prescription insurance?  Yes Type of insurance plan: [employer] commercial insurance  Outpatient Pharmacy:  Prior to admission outpatient pharmacy: [facility] Is the patient willing to use [facility] TOC pharmacy at discharge? Yes Is the patient willing to transition their outpatient pharmacy to utilize a [facility] outpatient pharmacy?   No    Assessment: 1. New systolic CHF (LVEF 20-25%). NYHA class II symptoms. - Does not appear to be volume overloaded on exam - Agree with reducing metoprolol XL to 25 mg BID - On losartan 100 mg daily, consider transitioning to Entresto 49/51 mg BID - Consider adding spironolactone and Jardiance 10 mg daily prior to discharge   Plan: 1) Medication changes recommended at this time: - Stop losartan - Start Entresto 49/51 mg  BID  2) Patient assistance: - Prior authorization required for [employer] copay $35 - copay card lowers to $10 per month - Prior authorization required for [place] (insurance prefers [place])  3)  Education  - Initial education completed - Full education to be completed prior to discharge  Sharen Hones, PharmD, BCPS Heart Failure Engineer, building services Phone [phone]

## 2022-11-11 NOTE — Progress Notes (Signed)
Post Tikosyn EKG shows SB 51 Qtc 416

## 2022-11-11 NOTE — Progress Notes (Signed)
Heart Failure Nurse Navigator Progress Note  PCP: Pa, [facility] PCP-Cardiologist: [place] Admission Diagnosis: None Admitted from: Home  Presentation:   Justin Perez presented for Tikosyn admission, patient had a successful DCCV on 8/30, reports some shortness of breath and BLE edema. 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 Patient verbalized his understanding of education, a HF TOC appointment was scheduled for 11/26/2022 @ 9 am.   ECHO/ LVEF: 20-25% New  Clinical Course:  Past Medical History:  Diagnosis Date   CHF (congestive heart failure) (HCC)    Hypertension    Morbid obesity with BMI of 50.0-59.9, adult (HCC)    OSA on CPAP      Social History   Socioeconomic History   Marital status: Married    Spouse name: Not on file   Number of children: 3   Years of education: Not on file   Highest education level: Not on file  Occupational History   Not on file  Tobacco Use   Smoking status: Never   Smokeless tobacco: Never  Substance and Sexual Activity   Alcohol use: Yes    Comment: occ   Drug use: Never   Sexual activity: Not on file  Other Topics Concern   Not on file  Social History Narrative   Not on file   Social Determinants of Health   Financial Resource Strain: Not on file  Food Insecurity: No Food Insecurity (11/10/2022)   Hunger Vital Sign    Worried About Running Out of Food in the Last Year: Never true    Ran Out of Food in the Last Year: Never true  Transportation Needs: No Transportation Needs (11/10/2022)   PRAPARE - Administrator, Civil Service (Medical): No    Lack of  Transportation (Non-Medical): No  Physical Activity: Not on file  Stress: Not on file  Social Connections: Not on file   Education Assessment and Provision:  Detailed education and instructions provided on heart failure disease management including the following:  Signs and symptoms of Heart Failure When to call the physician Importance of daily weights Low sodium diet Fluid restriction Medication management Anticipated future follow-up appointments  Patient education given on each of the above topics.  Patient acknowledges understanding via teach back method and acceptance of all instructions.  Education Materials:  "Living Better With Heart Failure" Booklet, HF zone tool, & Daily Weight Tracker Tool.  Patient has scale at home: yes Patient has pill box at home: yes    High Risk Criteria for Readmission and/or Poor Patient Outcomes: Heart failure hospital admissions (last 6 months): 0  No Show rate: 17% Difficult social situation: No Demonstrates medication adherence: Yes Primary Language: English Literacy level: Reading, writing, and comprehension  Barriers of Care:   New HF 20-25% Diet/ fluid restrictions (fluid amounts, works Holiday representative, drinks a lot on hot days)  Daily weights  Considerations/Referrals:   Referral made to Heart Failure Pharmacist Stewardship: Yes Referral made to Heart Failure CSW/NCM TOC: No Referral made to Heart & Vascular TOC clinic: Yes, 11/26/2022 @ 9 am.   Items for Follow-up on DC/TOC: Continued HF education ( New)  Diet/ fluid restrictions ( fluid amounts, works Holiday representative)  Daily [employer], Scientist, research (physical sciences), Scientist, clinical (histocompatibility and immunogenetics) Only

## 2022-11-12 ENCOUNTER — Encounter (HOSPITAL_COMMUNITY)
Admission: RE | Disposition: A | Discharge status: Home / Self Care | Payer: Self-pay | Source: Ambulatory Visit | Attending: Internal Medicine

## 2022-11-12 DIAGNOSIS — I4819 Other persistent atrial fibrillation: Secondary | ICD-10-CM | POA: Diagnosis not present

## 2022-11-12 LAB — MAGNESIUM: Magnesium: 2 mg/dL (ref 1.7–2.4)

## 2022-11-12 LAB — BASIC METABOLIC PANEL
Anion gap: 7 (ref 5–15)
BUN: 12 mg/dL (ref 6–20)
CO2: 23 mmol/L (ref 22–32)
Calcium: 8.6 mg/dL — ABNORMAL LOW (ref 8.9–10.3)
Chloride: 106 mmol/L (ref 98–111)
Creatinine, Ser: 0.81 mg/dL (ref 0.61–1.24)
GFR, Estimated: >60 mL/min (ref >60)
Glucose, Bld: 115 mg/dL — ABNORMAL HIGH (ref 70–99)
Potassium: 3.9 mmol/L (ref 3.5–5.1)
Sodium: 136 mmol/L (ref 135–145)

## 2022-11-12 SURGERY — CARDIOVERSION
Anesthesia: General

## 2022-11-12 MED ORDER — MAGNESIUM SULFATE 2 GM/50ML IV SOLN
2.0000 g | Freq: Once | INTRAVENOUS | Status: AC
  Administered 2022-11-12: 2 g via INTRAVENOUS
  Filled 2022-11-12: qty 50

## 2022-11-12 MED ORDER — POTASSIUM CHLORIDE CRYS ER 20 MEQ PO TBCR
40.0000 meq | EXTENDED_RELEASE_TABLET | Freq: Once | ORAL | Status: AC
  Administered 2022-11-12: 40 meq via ORAL
  Filled 2022-11-12: qty 2

## 2022-11-12 NOTE — Progress Notes (Addendum)
Pharmacy: Dofetilide (Tikosyn) - Follow Up Assessment and Electrolyte Replacement  Pharmacy consulted to assist in monitoring and replacing electrolytes in this 47 y.o. male admitted on 11/10/2022 undergoing dofetilide initiation. First dofetilide dose: 11/10/22  Labs:    Component Value Date/Time   K 3.9 11/12/2022 0427   MG 2.0 11/12/2022 0427     Plan: Potassium: K 3.8-3.9:  Give KCl 40 mEq po x1   Magnesium: Mg 1.8-2: Give Mg 2 gm IV x1   As patient has required on average 40 mEq of potassium replacement every day, recommend discharging patient with prescription for:  Potassium chloride 40 mEq  daily  Thank you for allowing pharmacy to participate in this patient's care   Trixie Rude, PharmD Clinical Pharmacist 11/12/2022  7:11 AM

## 2022-11-12 NOTE — Progress Notes (Signed)
20 beat run of NSVT. Most recent ECG w/ QTc well within normal limits. No adjustments to made at this time.

## 2022-11-12 NOTE — Progress Notes (Signed)
   Heart Failure Stewardship Pharmacist Progress Note   PCP: Pa, [facility] PCP-Cardiologist: Olga Millers, MD    HPI:  47 yo M with PMH of obesity, OSA, HTN, afib, and new systolic CHF.   He had a PCP visit on 8/23 where he was noted to be in afib RVR. He was started on metoprolol and Eliquis. Seen in afib clinic on 8/28, he was still in afib RVR. Underwent successful DCCV on 8/30. EF was found to be newly reduced to 20-25% (was 55-60% in 2020). Seen for follow up on 9/5 and was maintaining NSR. He was agreeable to starting Tikosyn for rhythm control. Admitted on 9/23 to initiate Tikosyn. Repeat ECHO showed improvement of EF to 50-55%.   Current HF Medications: Beta Blocker: metoprolol XL 25 mg daily ACE/ARB/ARNI: losartan 100 mg daily  Prior to admission HF Medications: Beta blocker: metoprolol tartrate 50 mg BID ACE/ARB/ARNI: losartan 100 mg daily  Pertinent Lab Values: Serum creatinine 0.81, BUN 12, Potassium 3.9, Sodium 136, Magnesium 2.0  Vital Signs: Weight: 415 lbs (admission weight: 415 lbs) Blood pressure: 130/70s  Heart rate: 50-60s  I/O: incomplete  Medication Assistance / Insurance Benefits Check: Does the patient have prescription insurance?  Yes Type of insurance plan: [facility] commercial insurance  Outpatient Pharmacy:  Prior to admission outpatient pharmacy: [facility] Is the patient willing to use [facility] TOC pharmacy at discharge? Yes Is the patient willing to transition their outpatient pharmacy to utilize a [facility] outpatient pharmacy?   No    Assessment: 1. 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 NYHA class II symptoms. - Does not appear to be volume overloaded on exam - Agree with reducing metoprolol XL to 25 mg daily - Continue losartan 100 mg daily. EF improved from 20-25% to 50-55%.  - Consider adding Jardiance 10 mg daily prior to discharge   Plan: 1) Medication changes recommended at this time: - Add Jardiance 10 mg daily  2) Patient  assistance: Sherryll Burger copay $60 - copay card lowers to $10 - Jardiance copay $35 - copay card lowers to $10 per month - Prior authorization required for [place] (insurance prefers [place])  3)  Education  - Initial education completed - Full education to be completed prior to discharge  Sharen Hones, PharmD, BCPS Heart Failure Engineer, building services Phone [phone]

## 2022-11-12 NOTE — Care Management (Signed)
11-12-22 1655 Patient presented for Tikosyn Load. Case Manager spoke with the patient regarding co pay cost. Patient is agreeable to cost and would like to have the initial Rx filled via [facility] and the Rx refills escribed to [facility] and [facility]. No further needs identified at this time.

## 2022-11-12 NOTE — Progress Notes (Signed)
Morning EKG reviewed     Shows remains in NSR with stable QTc at ~410-420 ms.  Continue  Tikosyn 250 mcg BID.   Potassium3.9 (09/25 0427) Magnesium  2.0 (09/25 0427) Creatinine, ser  0.81 (09/25 0427)  Plan for home Thursday if QTc remains stable   Graciella Freer, [place]  11/12/2022 11:24 AM

## 2022-11-12 NOTE — Progress Notes (Signed)
   Electrophysiology Rounding Note  Patient Name: Justin Perez Date of Encounter: 11/12/2022  Primary Cardiologist: Olga Millers, MD  Electrophysiologist: None    Subjective   Pt remains in NSR on Tikosyn 500 mcg BID   QTc from EKG last pm shows stable QTc at 420-430  The patient is doing well today.  At this time, the patient denies chest pain, shortness of breath, or any new concerns.  Inpatient Medications    Scheduled Meds:  apixaban  5 mg Oral BID   dofetilide  500 mcg Oral BID   influenza vac split trivalent PF  0.5 mL Intramuscular Tomorrow-1000   losartan  100 mg Oral Daily   metoprolol succinate  25 mg Oral Daily   potassium chloride  40 mEq Oral Once   sodium chloride flush  3 mL Intravenous Q12H   Continuous Infusions:  sodium chloride Stopped (11/11/22 1106)   magnesium sulfate bolus IVPB     PRN Meds: sodium chloride, sodium chloride flush   Vital Signs    Vitals:   11/11/22 0756 11/11/22 0900 11/11/22 1927 11/12/22 0433  BP: 117/77 136/77 131/76 139/71  Pulse:  (!) 53 (!) 55 60  Resp: 18  16 20   Temp: 97.9 F (36.6 C) 97.7 F (36.5 C) 98.8 F (37.1 C) 98.6 F (37 C)  TempSrc: Oral Oral Oral Oral  SpO2:  96% 92% 94%    Intake/Output Summary (Last 24 hours) at 11/12/2022 0715 Last data filed at 11/11/2022 1817 Gross per 24 hour  Intake 1.44 ml  Output --  Net 1.44 ml   There were no vitals filed for this visit.  Physical Exam    GEN- NAD, A&O x 3. Normal affect.  Lungs- CTAB, Normal effort.  Heart- Regular rate and rhythm. No M/G/R GI- Soft, NT, ND Extremities- No clubbing, cyanosis, or edema Skin- no rash or lesion  Labs    CBC No results for input(s): "WBC", "NEUTROABS", "HGB", "HCT", "MCV", "PLT" in the last 72 hours. Basic Metabolic Panel Recent Labs    08/17/14 0327 11/12/22 0427  NA 138 136  K 3.6 3.9  CL 102 106  CO2 26 23  GLUCOSE 98 115*  BUN 10 12  CREATININE 1.04 0.81  CALCIUM 9.1 8.6*  MG 2.0 2.0     Telemetry    Sinus brady - NSR 40-60s (personally reviewed)  Patient Profile     Justin Perez is a 47 y.o. male with a past medical history significant for persistent atrial fibrillation.  They were admitted for tikosyn load.   Assessment & Plan    Persistent atrial fibrillation Pt remains in NSR on Tikosyn 500 mcg BID  Continue Eliquis Creatinine, ser  0.81 (09/25 0427) Magnesium  2.0 (09/25 0427) Potassium3.9 (09/25 0427) Supplement K  Plan for home Thursday if QTc remains stable.  Obesity Encouraged lifestyle modification  Would need to lose significant weight to be an ablation candidate.  For questions or updates, please contact [facility] Please consult [email or web address] for contact info under Cardiology/STEMI.  Signed, Graciella Freer, PA-C  11/12/2022, 7:15 AM

## 2022-11-13 ENCOUNTER — Other Ambulatory Visit (HOSPITAL_COMMUNITY): Payer: Self-pay

## 2022-11-13 DIAGNOSIS — I4819 Other persistent atrial fibrillation: Secondary | ICD-10-CM | POA: Diagnosis not present

## 2022-11-13 LAB — BASIC METABOLIC PANEL
Anion gap: 6 (ref 5–15)
BUN: 12 mg/dL (ref 6–20)
CO2: 23 mmol/L (ref 22–32)
Calcium: 8.7 mg/dL — ABNORMAL LOW (ref 8.9–10.3)
Chloride: 105 mmol/L (ref 98–111)
Creatinine, Ser: 0.91 mg/dL (ref 0.61–1.24)
GFR, Estimated: >60 mL/min (ref >60)
Glucose, Bld: 97 mg/dL (ref 70–99)
Potassium: 4 mmol/L (ref 3.5–5.1)
Sodium: 134 mmol/L — ABNORMAL LOW (ref 135–145)

## 2022-11-13 LAB — MAGNESIUM: Magnesium: 2 mg/dL (ref 1.7–2.4)

## 2022-11-13 MED ORDER — DOFETILIDE 500 MCG PO CAPS
500.0000 ug | ORAL_CAPSULE | Freq: Two times a day (BID) | ORAL | 6 refills | Status: DC
  Filled 2022-11-13: qty 60, 30d supply, fill #0

## 2022-11-13 MED ORDER — METOPROLOL SUCCINATE ER 25 MG PO TB24
25.0000 mg | ORAL_TABLET | Freq: Every day | ORAL | 6 refills | Status: DC
  Filled 2022-11-13: qty 30, 30d supply, fill #0

## 2022-11-13 MED ORDER — MAGNESIUM SULFATE 2 GM/50ML IV SOLN
2.0000 g | Freq: Once | INTRAVENOUS | Status: AC
  Administered 2022-11-13: 2 g via INTRAVENOUS
  Filled 2022-11-13: qty 50

## 2022-11-13 NOTE — Progress Notes (Signed)
Pharmacy: Dofetilide (Tikosyn) - Follow Up Assessment and Electrolyte Replacement  Pharmacy consulted to assist in monitoring and replacing electrolytes in this 47 y.o. male admitted on 11/10/2022 undergoing dofetilide initiation.   Labs:    Component Value Date/Time   K 4.0 11/13/2022 0434   MG 2.0 11/13/2022 0434     Plan: Potassium: K >/= 4: No additional supplementation needed  Magnesium: Mg 1.8-2: Give Mg 2 gm IV x1    As patient has required on average 25 mEq of potassium replacement every day, recommend discharging patient with prescription for:  Potassium chloride 20 mEq  daily  Thank you for allowing pharmacy to participate in this patient's care   Harland German, PharmD Clinical Pharmacist **Pharmacist phone directory can now be found on [email or web address] ([email or web address]).  Listed under [facility].

## 2022-11-13 NOTE — Progress Notes (Signed)
   Heart Failure Stewardship Pharmacist Progress Note   PCP: Pa, [facility] PCP-Cardiologist: Olga Millers, MD    HPI:  47 yo M with PMH of obesity, OSA, HTN, afib, and new systolic CHF.   He had a PCP visit on 8/23 where he was noted to be in afib RVR. He was started on metoprolol and Eliquis. Seen in afib clinic on 8/28, he was still in afib RVR. Underwent successful DCCV on 8/30. EF was found to be newly reduced to 20-25% (was 55-60% in 2020). Seen for follow up on 9/5 and was maintaining NSR. He was agreeable to starting Tikosyn for rhythm control. Admitted on 9/23 to initiate Tikosyn. Repeat ECHO showed improvement of EF to 50-55%.   Current HF Medications: Beta Blocker: metoprolol XL 25 mg daily ACE/ARB/ARNI: losartan 100 mg daily  Prior to admission HF Medications: Beta blocker: metoprolol tartrate 50 mg BID ACE/ARB/ARNI: losartan 100 mg daily  Pertinent Lab Values: Serum creatinine 0.91, BUN 12, Potassium 4.0, Sodium 134, Magnesium 2.0  Vital Signs: Weight: 415 lbs (admission weight: 415 lbs) Blood pressure: 130/70s  Heart rate: 50-60s  I/O: incomplete  Medication Assistance / Insurance Benefits Check: Does the patient have prescription insurance?  Yes Type of insurance plan: [facility] commercial insurance  Outpatient Pharmacy:  [facility] Is the patient willing to use [facility] TOC pharmacy at discharge? Yes Is the patient willing to transition their outpatient pharmacy to utilize a [facility] outpatient pharmacy?   No    Assessment: 1. 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 NYHA class II symptoms. - Does not appear to be volume overloaded on exam - Continue metoprolol XL to 25 mg daily - Continue losartan 100 mg daily. EF improved from 20-25% to 50-55%.  - Consider adding Jardiance 10 mg daily prior to discharge   Plan: 1) Medication changes recommended at this time: - Add Jardiance 10 mg daily  2) Patient  assistance: Sherryll Burger copay [phone] - copay card lowers to $10 - Jardiance copay $35 - copay card lowers to $10 per month - Prior authorization required for [place] (insurance prefers [place])  3)  Education  - Patient has been educated on current HF medications and potential additions to HF medication regimen - Patient verbalizes understanding that over the next few months, these medication doses may change and more medications may be added to optimize HF regimen - Patient has been educated on basic disease state pathophysiology and goals of therapy   Sharen Hones, PharmD, BCPS Heart Failure Engineer, building services Phone [phone]

## 2022-11-13 NOTE — Progress Notes (Signed)
A few drops in heart rate to 39 non-sustained while sleeping.

## 2022-11-13 NOTE — Discharge Instructions (Signed)
 Dofetilide Capsules What is this medication? DOFETILIDE (doe FET il ide) treats a fast or irregular heartbeat (arrhythmia). It works by slowing down overactive electric signals in the heart, which stabilizes your heart rhythm. It belongs to a group of medications called antiarrhythmics. This medicine may be used for other purposes; ask your health care provider or pharmacist if you have questions. COMMON BRAND NAME(S): Tikosyn What should I tell my care team before I take this medication? They need to know if you have any of these conditions: Heart disease History of irregular heartbeat History of low levels of potassium or magnesium in the blood Kidney disease Liver disease An unusual or allergic reaction to dofetilide, other medications, foods, dyes, or preservatives Pregnant or trying to get pregnant Breast-feeding How should I use this medication? Take this medication by mouth with a glass of water. Follow the directions on the prescription label. Do not take with grapefruit juice. You can take it with or without food. If it upsets your stomach, take it with food. Take your medication at regular intervals. Do not take it more often than directed. Do not stop taking except on your care team's advice. A special MedGuide will be given to you by the pharmacist with each prescription and refill. Be sure to read this information carefully each time. Talk to your care team about the use of this medication in children. Special care may be needed. Overdosage: If you think you have taken too much of this medicine contact a poison control center or emergency room at once. NOTE: This medicine is only for you. Do not share this medicine with others. What if I miss a dose? If you miss a dose, skip it. Take your next dose at the normal time. Do not take extra or 2 doses at the same time to make up for the missed dose. What may interact with this medication? Do not take this medication with any of the  following: Benadryl (Diphenhydramine) Cimetidine Cisapride Dolutegravir Dronedarone Erdafitinib Hydrochlorothiazide Immodium Ketoconazole Megestrol Pimozide Prochlorperazine Thioridazine Trimethoprim Verapamil This medication may also interact with the following: Amiloride Cannabinoids Certain antibiotics like erythromycin or clarithromycin Certain antiviral medications for HIV or hepatitis Certain medications for depression, anxiety, or psychotic disorders Digoxin Diltiazem Grapefruit juice Metformin Nefazodone Other medications that prolong the QT interval (an abnormal heart rhythm) Quinine Triamterene Zafirlukast Ziprasidone This list may not describe all possible interactions. Give your health care provider a list of all the medicines, herbs, non-prescription drugs, or dietary supplements you use. Also tell them if you smoke, drink alcohol, or use illegal drugs. Some items may interact with your medicine. What should I watch for while using this medication? Your condition will be monitored carefully while you are receiving this medication. What side effects may I notice from receiving this medication? Side effects that you should report to your care team as soon as possible: Allergic reactions--skin rash, itching, hives, swelling of the face, lips, tongue, or throat Chest pain Heart rhythm changes--fast or irregular heartbeat, dizziness, feeling faint or lightheaded, chest pain, trouble breathing Side effects that usually do not require medical attention (report to your care team if they continue or are bothersome): Dizziness Headache Nausea Stomach pain Trouble sleeping This list may not describe all possible side effects. Call your doctor for medical advice about side effects. You may report side effects to FDA at 1-800-FDA-1088. Where should I keep my medication? Keep out of the reach of children. Store at room temperature between 15 and 30 degrees  C (59 and 86  degrees F). Throw away any unused medication after the expiration date. NOTE: This sheet is a summary. It may not cover all possible information. If you have questions about this medicine, talk to your doctor, pharmacist, or health care provider.  2024 Elsevier/Gold Standard (2021-01-04 00:00:00)

## 2022-11-13 NOTE — Discharge Summary (Signed)
ELECTROPHYSIOLOGY DISCHARGE SUMMARY    Patient ID: Justin Perez,  MRN: [id number], DOB/AGE: 10-17-1975 47 y.o.  Admit date: 11/10/2022 Discharge date: 11/13/2022  Primary Care Physician: [facility]  Primary Cardiologist: Olga Millers, MD  Electrophysiologist: Dr. 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  Tikosyn loading   2. Echocardiogram 11/11/2022 LVEF 50-55%  Brief HPI: Justin Perez is a 47 y.o. male with a past medical history as noted above.  They were referred to EP for treatment options of atrial fibrillation.  Risks, benefits, and alternatives to Tikosyn were reviewed with the patient who wished to proceed with admission for loading.  Hospital Course:  The patient was admitted and Tikosyn was initiated.  Renal function and electrolytes were followed during the hospitalization.  Their QTc remained stable. The patient presented in sinus rhythm and did not require cardioversion. The patients QTc remained stable. They were monitored on telemetry up to discharge. On the day of discharge, they were examined by Dr. Jimmey Ralph  who considered them stable for discharge to home.  Follow-up has been arranged with the Atrial Fibrillation clinic in approximately 1 week.   Physical Exam: Vitals:   11/12/22 0433 11/12/22 1500 11/12/22 2030 11/13/22 0449  BP: 139/71 135/73 135/69 118/66  Pulse: 60 61 64 (!) 51  Resp: 20 19 18 19   Temp: 98.6 F (37 C) 98.9 F (37.2 C) 97.6 F (36.4 C) 97.6 F (36.4 C)  TempSrc: Oral Oral Oral Oral  SpO2: 94%  93% 95%    GEN- NAD, A&O x 3. Normal affect.  Lungs- CTAB, Normal effort.  Heart- Regular rate and rhythm. No M/G/R GI- Soft, NT, ND Extremities- No clubbing, cyanosis, or edema Skin- no rash or lesion  Labs:    Lab Results  Component Value Date   WBC 3.5 (L) 04/20/2019   HGB 15.6 10/17/2022   HCT 46.0 10/17/2022   MCV 92.2 04/20/2019   PLT 135 (L) 04/20/2019    Recent Labs  Lab 11/13/22 0434  NA 134*  K 4.0  CL 105  CO2 23  BUN 12  CREATININE 0.91  CALCIUM 8.7*  GLUCOSE 97    Discharge Medications:  Allergies as of 11/13/2022   No Known Allergies      Medication List     STOP taking these medications    metoprolol tartrate 50 MG tablet Commonly known as: LOPRESSOR   pantoprazole 40 MG tablet Commonly known as: PROTONIX       TAKE these medications    acetaminophen 500 MG tablet Commonly known as: TYLENOL Take 1,000 mg by mouth as needed for moderate pain.   dofetilide 500 MCG capsule Commonly known as: TIKOSYN Take 1 capsule (500 mcg total) by mouth 2 (two) times daily.   Eliquis 5 MG Tabs tablet Generic drug: apixaban Take 1 tablet (5 mg total) by mouth 2 (two) times daily.   losartan 100 MG tablet Commonly known as: COZAAR Take 1 tablet (100 mg total) by mouth daily.   metoprolol succinate 25 MG 24 hr tablet Commonly known as: TOPROL-XL Take 1 tablet (25 mg total) by mouth daily.   Pepcid 20 MG tablet Generic drug: famotidine Take 20 mg by mouth as needed for heartburn or indigestion.        Disposition:    Follow-up  Information     [facility]. Go in 13 day(s).   Specialty: Cardiology Why: Hospital follow up 11/26/2022 @ 9 am PLEASE bring a current medication list to appointment FREE valet parking, Entrance C, off [employer] information: [address] [place] [address] [phone]                Duration of Discharge Encounter: Greater than 30 minutes including physician time.  Dustin Flock, PA-C  11/13/2022 10:12 AM

## 2022-11-13 NOTE — Progress Notes (Signed)
EKG from yesterday evening 11/12/2022 reviewed     Shows remains in NSR at 51 bpm with stable QTc at ~420 ms.  Continue  Tikosyn 500 mcg BID.   Potassium4.0 (09/26 0434) Magnesium  2.0 (09/26 0434) Creatinine, ser  0.91 (09/26 0434)  Plan for home this afternoon if QTc remains stable.   Graciella Freer, PA-C  11/13/2022 7:07 AM

## 2022-11-20 ENCOUNTER — Ambulatory Visit (HOSPITAL_COMMUNITY)
Admit: 2022-11-20 | Discharge: 2022-11-20 | Disposition: A | Discharge status: Home / Self Care | Payer: 59 | Source: Ambulatory Visit | Attending: Internal Medicine | Admitting: Internal Medicine

## 2022-11-20 ENCOUNTER — Encounter (HOSPITAL_COMMUNITY): Payer: Self-pay | Admitting: Internal Medicine

## 2022-11-20 VITALS — BP 138/90 | HR 59 | Ht 72.0 in | Wt >= 6400 oz

## 2022-11-20 DIAGNOSIS — Z6841 Body Mass Index (BMI) 40.0 and over, adult: Secondary | ICD-10-CM | POA: Insufficient documentation

## 2022-11-20 DIAGNOSIS — I4819 Other persistent atrial fibrillation: Secondary | ICD-10-CM | POA: Insufficient documentation

## 2022-11-20 DIAGNOSIS — Z79899 Other long term (current) drug therapy: Secondary | ICD-10-CM | POA: Diagnosis not present

## 2022-11-20 DIAGNOSIS — D6869 Other thrombophilia: Secondary | ICD-10-CM | POA: Insufficient documentation

## 2022-11-20 DIAGNOSIS — I5020 Unspecified systolic (congestive) heart failure: Secondary | ICD-10-CM | POA: Diagnosis present

## 2022-11-20 DIAGNOSIS — Z7901 Long term (current) use of anticoagulants: Secondary | ICD-10-CM | POA: Diagnosis not present

## 2022-11-20 DIAGNOSIS — E669 Obesity, unspecified: Secondary | ICD-10-CM | POA: Insufficient documentation

## 2022-11-20 DIAGNOSIS — Z5181 Encounter for therapeutic drug level monitoring: Secondary | ICD-10-CM | POA: Insufficient documentation

## 2022-11-20 DIAGNOSIS — R9431 Abnormal electrocardiogram [ECG] [EKG]: Secondary | ICD-10-CM | POA: Diagnosis not present

## 2022-11-20 DIAGNOSIS — I110 Hypertensive heart disease with heart failure: Secondary | ICD-10-CM | POA: Insufficient documentation

## 2022-11-20 LAB — BASIC METABOLIC PANEL
Anion gap: 12 (ref 5–15)
BUN: 12 mg/dL (ref 6–20)
CO2: 21 mmol/L — ABNORMAL LOW (ref 22–32)
Calcium: 9.1 mg/dL (ref 8.9–10.3)
Chloride: 102 mmol/L (ref 98–111)
Creatinine, Ser: 0.94 mg/dL (ref 0.61–1.24)
GFR, Estimated: >60 mL/min (ref >60)
Glucose, Bld: 103 mg/dL — ABNORMAL HIGH (ref 70–99)
Potassium: 4.2 mmol/L (ref 3.5–5.1)
Sodium: 135 mmol/L (ref 135–145)

## 2022-11-20 LAB — MAGNESIUM: Magnesium: 1.8 mg/dL (ref 1.7–2.4)

## 2022-11-20 MED ORDER — DOFETILIDE 500 MCG PO CAPS: 500.0000 ug | ORAL_CAPSULE | Freq: Two times a day (BID) | ORAL | 1 refills | Status: DC

## 2022-11-20 MED ORDER — FUROSEMIDE 20 MG PO TABS: 20.0000 mg | ORAL_TABLET | Freq: Every day | ORAL | 1 refills | Status: AC | PRN

## 2022-11-20 NOTE — Progress Notes (Signed)
Primary Care Physician: [facility] Primary Cardiologist: Olga Millers, MD Electrophysiologist: None     Referring Physician: [facility]     Justin Perez is a 47 y.o. male with a history of obesity, OSA, new systolic HF from echo TEE 10/17/22, HTN, and atrial fibrillation who presents for consultation in the [facility]. Symptoms of chest pain and SOB seen by PCP noted to be in Afib with RVR on 10/10/22. He was prescribed Lopressor 25 mg BID. Patient is on Eliquis for a CHADS2VASC score of 2.  On evaluation today 8/28, he is currently in Afib with RVR. He drinks 3 - 16 oz diet cokes daily. He drinks occasional alcohol. He does have sleep apnea but admits to sometimes falling asleep without CPAP mask on his face and will wake up gasping for air. He notes to have SOB and palpitations and lower extremity swelling and dizziness. He began full anticoagulation with Eliquis on 8/23.   On follow up 10/23/22, he is currently in NSR. He is s/p successful DCCV on 8/30. TEE with DCCV showed severely decreased biventricular systolic function. He is now compliant with CPAP. 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 He is trying to lose weight.  On follow up 11/10/22, patient is here for Tikosyn admission. No new medications since last OV. He stopped hydrochlorothiazide over 3 days prior to today's planned admission. No benadryl use. No missed doses of anticoagulant.   On follow up 11/20/22, he is currently in NSR. S/p Tikosyn admission 9/23-26. He did not require cardioversion. He was discharged on 500 mcg dose. He is very happy that repeat echo during hospital admission noted improvement in EF to 50-55%. Toprol decreased to 25 mg daily. He feels well overall. No missed doses of Tikosyn or Eliquis.   Today, he denies symptoms of chest pain, orthopnea, PND, presyncope, syncope, snoring, daytime somnolence, bleeding,  or neurologic sequela. The patient is tolerating medications without difficulties and is otherwise without complaint today.    Atrial Fibrillation Risk Factors:  he does have symptoms or diagnosis of sleep apnea. he is compliant with CPAP therapy.  he has a BMI of Body mass index is 56.66 kg/m.Marland Kitchen Filed Weights   11/20/22 0923  Weight: (!) 189.5 kg      Current Outpatient Medications  Medication Sig Dispense Refill   acetaminophen (TYLENOL) 500 MG tablet Take 1,000 mg by mouth as needed for moderate pain.     dofetilide (TIKOSYN) 500 MCG capsule Take 1 capsule (500 mcg total) by mouth 2 (two) times daily. 60 capsule 6   ELIQUIS 5 MG TABS tablet Take 1 tablet (5 mg total) by mouth 2 (two) times daily. 60 tablet 3   famotidine (PEPCID) 20 MG tablet Take 20 mg by mouth as needed for heartburn or indigestion.     furosemide (LASIX) 20 MG tablet Take 1 tablet (20 mg total) by mouth daily as needed (swelling/wt gain). 30 tablet 1   losartan (COZAAR) 100 MG tablet Take 1 tablet (100 mg total) by mouth daily. 90 tablet 3   metoprolol succinate (TOPROL-XL) 25 MG 24 hr tablet Take 1 tablet (25 mg total) by mouth daily. 30 tablet 6   No current facility-administered medications for this encounter.    Atrial Fibrillation Management history:  Previous antiarrhythmic drugs: Tikosyn Previous cardioversions: 10/17/22 Previous ablations: None Anticoagulation history: Eliquis  ROS- All systems are reviewed and negative except as per the  HPI above.  Physical Exam: BP (!) 138/90   Pulse (!) 59   Ht 6' (1.829 m)   Wt (!) 189.5 kg   BMI 56.66 kg/m   GEN- The patient is well appearing, alert and oriented x 3 today.   Neck - no JVD or carotid bruit noted Lungs- Clear to ausculation bilaterally, normal work of breathing Heart- Regular rate and rhythm, no murmurs, rubs or gallops, PMI not laterally displaced Extremities- no clubbing, cyanosis, or edema Skin - no rash or ecchymosis  noted   EKG today demonstrates  Vent. rate 59 BPM PR interval 156 ms QRS duration 96 ms QT/QTcB 402/397 ms P-R-T axes 52 117 64 Sinus bradycardia Right axis deviation Low voltage QRS Nonspecific T wave abnormality Abnormal ECG When compared with ECG of 13-Nov-2022 10:04, PREVIOUS ECG IS PRESENT  Echo 10/17/22 TEE: 1. Left ventricular ejection fraction, by estimation, is 20 to 25%. The  left ventricle has severely decreased function.   2. Right ventricular systolic function is severely reduced. The right  ventricular size is moderately enlarged.   3. Left atrial size was severely dilated. No left atrial/left atrial  appendage thrombus was detected. The LAA emptying velocity was 43 cm/s.   4. Right atrial size was moderately dilated.   5. The mitral valve is normal in structure. Mild mitral valve  regurgitation. No evidence of mitral stenosis.   6. Tricuspid valve regurgitation is mild to moderate.   7. The aortic valve is tricuspid. Aortic valve regurgitation is not  visualized. No aortic stenosis is present.    ASSESSMENT & PLAN CHA2DS2-VASc Score = 2 The patient's score is based upon: CHF History: 1 HTN History: 1 Diabetes History: 0 Stroke History: 0 Vascular Disease History: 0 Age Score: 0 Gender Score: 0      ASSESSMENT AND PLAN: Persistent Atrial Fibrillation (ICD10:  I48.0) The patient's CHA2DS2-VASc score is 2, indicating a 2.2% annual risk of stroke.   S/p Tikosyn admission 9/23-26/24.  He is currently in NSR. Qtc stable. Continue Tikosyn 500 mcg BID. Bmet and mag drawn today.   I will send a PRN lasix prescription per patient's request. 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 I did counsel him on compression stockings to wear during the hours where he has prolonged standing and maybe can avoid lasix altogether. Cautioned on taking consistently as may lower potassium.   Secondary hypercoagulable state due  to atrial fibrillation - Chads 2 Continue Eliquis without interruption.    F/u 1 month Tikosyn surveillance.    Lake Bells, PA-C  Afib Clinic [facility] [address] [place], [place] [address] [phone]

## 2022-11-20 NOTE — Patient Instructions (Signed)
Lasix 20mg  -- take 1 tablet daily as needed for swelling/wt gain (3-5lbs from one day to the next)

## 2022-11-21 ENCOUNTER — Other Ambulatory Visit (HOSPITAL_COMMUNITY): Payer: Self-pay | Admitting: *Deleted

## 2022-11-21 MED ORDER — MAGNESIUM OXIDE 400 MG PO TABS: 400.0000 mg | ORAL_TABLET | Freq: Every day | ORAL | 1 refills | Status: DC

## 2022-11-26 ENCOUNTER — Encounter (HOSPITAL_COMMUNITY): Payer: Self-pay

## 2022-11-26 ENCOUNTER — Other Ambulatory Visit (HOSPITAL_COMMUNITY): Payer: Self-pay

## 2022-11-26 ENCOUNTER — Ambulatory Visit (HOSPITAL_COMMUNITY)
Admit: 2022-11-26 | Discharge: 2022-11-26 | Disposition: A | Discharge status: Home / Self Care | Payer: 59 | Attending: Physician Assistant | Admitting: Physician Assistant

## 2022-11-26 VITALS — BP 139/73 | HR 53 | Wt >= 6400 oz

## 2022-11-26 DIAGNOSIS — Z7901 Long term (current) use of anticoagulants: Secondary | ICD-10-CM | POA: Diagnosis not present

## 2022-11-26 DIAGNOSIS — I110 Hypertensive heart disease with heart failure: Secondary | ICD-10-CM | POA: Diagnosis present

## 2022-11-26 DIAGNOSIS — I5032 Chronic diastolic (congestive) heart failure: Secondary | ICD-10-CM

## 2022-11-26 DIAGNOSIS — G4733 Obstructive sleep apnea (adult) (pediatric): Secondary | ICD-10-CM | POA: Insufficient documentation

## 2022-11-26 DIAGNOSIS — I480 Paroxysmal atrial fibrillation: Secondary | ICD-10-CM | POA: Insufficient documentation

## 2022-11-26 DIAGNOSIS — Z79899 Other long term (current) drug therapy: Secondary | ICD-10-CM | POA: Insufficient documentation

## 2022-11-26 DIAGNOSIS — I5042 Chronic combined systolic (congestive) and diastolic (congestive) heart failure: Secondary | ICD-10-CM | POA: Diagnosis not present

## 2022-11-26 DIAGNOSIS — I10 Essential (primary) hypertension: Secondary | ICD-10-CM | POA: Diagnosis not present

## 2022-11-26 DIAGNOSIS — Z6841 Body Mass Index (BMI) 40.0 and over, adult: Secondary | ICD-10-CM | POA: Diagnosis not present

## 2022-11-26 MED ORDER — EMPAGLIFLOZIN 10 MG PO TABS: 10.0000 mg | ORAL_TABLET | Freq: Every day | ORAL | 3 refills | Status: DC

## 2022-11-26 NOTE — Patient Instructions (Addendum)
Medication Changes:  START: JARDIANCE 10MG  ONCE DAILY   Referrals:  YOU HAVE BEEN REFERRED TO [facility]MD AT [facility] THEY WILL REACH OUT TO YOU OR CALL TO ARRANGE THIS. PLEASE CALL us WITH ANY CONCERNS    ORDER GIVEN FOR COMPRESSION STOCKINGS  Follow-Up in: WITH GENERAL CARDIOLOGY- DR. Ludwig Clarks OFFICE ON 10/30 AT 9:20AM  At the [facility], you and your health needs are our priority. We have a designated team specialized in the treatment of Heart Failure. This Care Team includes your primary Heart Failure Specialized Cardiologist (physician), Advanced Practice Providers (APPs- Physician Assistants and Nurse Practitioners), and Pharmacist who all work together to provide you with the care you need, when you need it.   You may see any of the following providers on your designated Care Team at your next follow up:  Dr. Arvilla Meres Dr. Marca Ancona Dr. Dorthula Nettles Dr. Theresia Bough Tonye Becket, NP Robbie Lis, [place] [facility] [place], [place] Brynda Peon, NP [place] Lee, NP Karle Plumber, PharmD   Please be sure to bring in all your medications bottles to every appointment.   Need to Contact us:  If you have any questions or concerns before your next appointment please send [place] a message through [place] or call our office at [phone].    TO LEAVE A MESSAGE FOR THE NURSE SELECT OPTION 2, PLEASE LEAVE A MESSAGE INCLUDING: YOUR NAME DATE OF BIRTH CALL BACK NUMBER REASON FOR CALL**this is important as we prioritize the call backs  YOU WILL RECEIVE A CALL BACK THE SAME DAY AS LONG AS YOU CALL BEFORE 4:00 PM

## 2022-11-26 NOTE — Progress Notes (Addendum)
HEART & VASCULAR TRANSITION OF CARE CONSULT NOTE     Referring Physician: Dr. Jimmey Ralph Primary Care: [facility] Primary Cardiologist: Dr. Jens Som  HPI: Referred to clinic by Dr. Jimmey Ralph with EP for heart failure consultation. 47 y.o. male with history of morbid obesity, HTN, OSA on CPAP, PAF, HFrEF w/ recovered EF.   Patient was admitted in May 2020 with abrupt onset of dyspnea and hemoptysis.  COVID negative.  Chest CT w/ bilateral airspace disease but no pulmonary embolus. Echo: normal LV function and biatrial enlargement.  Angiotensin converting enzyme and rheumatoid factor normal, hypersensitivity pneumonitis panel and ANCA negative. 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 Treated with diuretics with improvement.    Seen by PCP in 08/24 and found to be in Afib w/ RVR. He was seen by Afib clinic and set up for TEE/DCCV.  Underwent TEE/DCCV to SR on 10/17/22. TEE at that time with EF 20-25%, RV severely reduced, severe LAE, moderate RAE, mild MR, mild to moderate TR  He was admitted for Tikosyn loading on 11/10/22. He remained in normal sinus rhythm during the admission, QTc stable. Echo repeated with improvement in LVEF to 50-55%.  He is here today for evaluation of heart failure. He has been feeling much better with restoration of sinus rhythm. Energy level, dyspnea and lower extremity edema have significantly improved. He still gets some lower extremity edema after standing on his feet all day at work. He is having a hard time finding compression stockings that don't cause significant discomfort. Patient is taking all medications as prescribed and reports wearing CPAP nightly. Has struggled with weight for a long time and is interested in trying a GLP-1 RA.   He works as a Games developer. Consumes ETOH rarely on social occasions, no tobacco or drug use.   His mother had CHF which was felt to be possibly 2/2 DM. No family history  of SCD.    Past Medical History:  Diagnosis Date   CHF (congestive heart failure) (HCC)    Hypertension    Morbid obesity with BMI of 50.0-59.9, adult (HCC)    OSA on CPAP     Current Outpatient Medications  Medication Sig Dispense Refill   acetaminophen (TYLENOL) 500 MG tablet Take 1,000 mg by mouth as needed for moderate pain.     dofetilide (TIKOSYN) 500 MCG capsule Take 1 capsule (500 mcg total) by mouth 2 (two) times daily. 180 capsule 1   ELIQUIS 5 MG TABS tablet Take 1 tablet (5 mg total) by mouth 2 (two) times daily. 60 tablet 3   famotidine (PEPCID) 20 MG tablet Take 20 mg by mouth as needed for heartburn or indigestion.     furosemide (LASIX) 20 MG tablet Take 1 tablet (20 mg total) by mouth daily as needed (swelling/wt gain). 30 tablet 1   losartan (COZAAR) 100 MG tablet Take 1 tablet (100 mg total) by mouth daily. 90 tablet 3   magnesium oxide (MAG-OX) 400 MG tablet Take 1 tablet (400 mg total) by mouth daily. 90 tablet 1   metoprolol succinate (TOPROL-XL) 25 MG 24 hr tablet Take 1 tablet (25 mg total) by mouth daily. 30 tablet 6   No current facility-administered medications for this encounter.    No Known Allergies    Social History   Socioeconomic History   Marital status: Married    Spouse name: Rachael   Number of children: 3   Years of education: Not on file  Highest education level: Associate degree: occupational, Scientist, product/process development, or vocational program  Occupational History   Occupation: [employer] representative  Tobacco Use   Smoking status: Never   Smokeless tobacco: Never   Tobacco comments:    Never smoked 11/20/22  Vaping Use   Vaping status: Never Used  Substance and Sexual Activity   Alcohol use: Yes    Comment: occ   Drug use: Never   Sexual activity: Not on file  Other Topics Concern   Not on file  Social History Narrative   Not on file   Social Determinants of Health   Financial Resource Strain: Low Risk  (11/11/2022)   Overall Financial  Resource Strain (CARDIA)    Difficulty of Paying Living Expenses: Not very hard  Food Insecurity: No Food Insecurity (11/10/2022)   Hunger Vital Sign    Worried About Running Out of Food in the Last Year: Never true    Ran Out of Food in the Last Year: Never true  Transportation Needs: No Transportation Needs (11/10/2022)   PRAPARE - Administrator, Civil Service (Medical): No    Lack of Transportation (Non-Medical): No  Physical Activity: Not on file  Stress: Not on file  Social Connections: Not on file  Intimate Partner Violence: Not At Risk (11/10/2022)   Humiliation, Afraid, Rape, and Kick questionnaire    Fear of Current or Ex-Partner: No    Emotionally Abused: No    Physically Abused: No    Sexually Abused: No      Family History  Problem Relation Age of Onset   Heart failure Mother 31   Diabetes Mother    COPD Father     Vitals:   11/26/22 0909  BP: 139/73  Pulse: (!) 53  SpO2: 93%  Weight: (!) 190 kg (418 lb 12.8 oz)    PHYSICAL EXAM: General:  Well appearing. No respiratory difficulty HEENT: normal Neck: supple. no JVD. Carotids 2+ bilat; no bruits. No lymphadenopathy or thryomegaly appreciated. Cor: PMI nondisplaced. Regular rate & rhythm. No rubs, gallops or murmurs. Lungs: clear Abdomen: soft, nontender, nondistended. No hepatosplenomegaly. No bruits or masses. Good bowel sounds. Extremities: no cyanosis, clubbing, rash, edema Neuro: alert & oriented x 3, cranial nerves grossly intact. moves all 4 extremities w/o difficulty. Affect pleasant.  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 EF recovered with restoration of SR. Will be imperative to maintain SR.  -NYHA II.  Volume looks good. Has lasix to use PRN. Suspect venous insufficiency is contributing to lower extremity edema. Compression stockings are uncomfortable. Given Rx to get fitted for stockings. -Continue Losartan 100 mg daily -Continue Toprol xl 25 mg daily  -Add Jardiance 10 mg daily, given copay card -Can consider spiro next if needed for BP -Recent BMET stable -Continue CPAP.  2. PAF -Diagnosed 08/24. S/p TEE/DCCV to SR 09/24. -On Tikosyn 500 mcg BID + metoprolol succinate 25 mg daily per EP -Anticoagulated with Eliquis 5 mg BID -Sinus brady on ECG today  3. HTN -BP above goal -Not monitoring regularly. SBP had been averaging 120s before metoprolol was recently reduced. He will monitor at home and bring log to follow-up with Cardiology. Can consider spiro as above. -Weight loss will help with managing hypertension  4. OSA -Reports compliance with CPAP  5. Morbid obesity -BMI 56.8 -He is motivated to work on weight loss -Will refer to Pharmacy for consideration of GLP-1 RA    Referred to HFSW (PCP, Medications, Transportation, ETOH Abuse, Drug Abuse, Insurance, Surveyor, quantity ): No Refer to Pharmacy: Yes, cost of Jardiance, GLP-1 RA Refer to Home Health: No Refer to [facility]: No Refer to General Cardiology: Yes  Follow up  PRN, referred back to Dr. Jens Som for follow-up

## 2022-12-04 NOTE — Progress Notes (Unsigned)
HPI: FU atrial fibrillation. Cardiac cath 8/11 showed normal LV function; normal coronaries. Echo 5/20 showed normal LV function; biatrial enlargement. Nuclear study June 2020 showed ejection fraction 39% and no definite perfusion defects. Calculated ejection fraction felt to underestimate. Patient has had occasional chest pain since his catheterization in 2011.  Patient admitted August 2024 with atrial fibrillation.  Transesophageal echocardiogram showed ejection fraction 20 to 25%, severe RV dysfunction with moderate right ventricular enlargement, severe left atrial enlargement, no left atrial appendage thrombus, moderate right atrial enlargement, mild mitral regurgitation, mild to moderate tricuspid regurgitation.  Patient subsequent underwent cardioversion.  Follow-up echocardiogram September 2024 showed improvement in LV function with ejection fraction 50 to 55%.  Subsequently admitted and placed on Tikosyn for his atrial fibrillation.  Since last seen the patient denies any dyspnea on exertion, orthopnea, PND, pedal edema, palpitations, syncope or chest pain.   Current Outpatient Medications  Medication Sig Dispense Refill   acetaminophen (TYLENOL) 500 MG tablet Take 1,000 mg by mouth as needed for moderate pain.     dofetilide (TIKOSYN) 500 MCG capsule Take 1 capsule (500 mcg total) by mouth 2 (two) times daily. 180 capsule 1   ELIQUIS 5 MG TABS tablet Take 1 tablet (5 mg total) by mouth 2 (two) times daily. 60 tablet 3   empagliflozin (JARDIANCE) 10 MG TABS tablet Take 1 tablet (10 mg total) by mouth daily before breakfast. 30 tablet 3   famotidine (PEPCID) 20 MG tablet Take 20 mg by mouth as needed for heartburn or indigestion.     furosemide (LASIX) 20 MG tablet Take 1 tablet (20 mg total) by mouth daily as needed (swelling/wt gain). 30 tablet 1   magnesium oxide (MAG-OX) 400 MG tablet Take 1 tablet (400 mg total) by mouth daily. 90 tablet 1   metoprolol succinate (TOPROL-XL) 25 MG 24  hr tablet Take 1 tablet (25 mg total) by mouth daily. 30 tablet 6   losartan (COZAAR) 100 MG tablet Take 1 tablet (100 mg total) by mouth daily. 90 tablet 3   No current facility-administered medications for this visit.     Past Medical History:  Diagnosis Date   CHF (congestive heart failure) (HCC)    Hypertension    Morbid obesity with BMI of 50.0-59.9, adult (HCC)    OSA on CPAP     Past Surgical History:  Procedure Laterality Date   biceps surgery     CARDIOVERSION N/A 10/17/2022   Procedure: CARDIOVERSION;  Surgeon: Little Ishikawa, MD;  Location: [facility] INVASIVE CV LAB;  Service: Cardiovascular;  Laterality: N/A;   TEE WITHOUT CARDIOVERSION N/A 10/17/2022   Procedure: TRANSESOPHAGEAL ECHOCARDIOGRAM;  Surgeon: Little Ishikawa, MD;  Location: [facility] INVASIVE CV LAB;  Service: Cardiovascular;  Laterality: N/A;   TONSILLECTOMY     URETHRA SURGERY      Social History   Socioeconomic History   Marital status: Married    Spouse name: Rachael   Number of children: 3   Years of education: Not on file   Highest education level: Associate degree: occupational, Scientist, product/process development, or vocational program  Occupational History   Occupation: [employer] representative  Tobacco Use   Smoking status: Never   Smokeless tobacco: Never   Tobacco comments:    Never smoked 11/20/22  Vaping Use   Vaping status: Never Used  Substance and Sexual Activity   Alcohol use: Yes    Comment: occ   Drug use: Never   Sexual activity: Not on file  Other Topics  Concern   Not on file  Social History Narrative   Not on file   Social Determinants of Health   Financial Resource Strain: Low Risk  (11/11/2022)   Overall Financial Resource Strain (CARDIA)    Difficulty of Paying Living Expenses: Not very hard  Food Insecurity: No Food Insecurity (11/10/2022)   Hunger Vital Sign    Worried About Running Out of Food in the Last Year: Never true    Ran Out of Food in the Last Year: Never true  Transportation  Needs: No Transportation Needs (11/10/2022)   PRAPARE - Administrator, Civil Service (Medical): No    Lack of Transportation (Non-Medical): No  Physical Activity: Not on file  Stress: Not on file  Social Connections: Not on file  Intimate Partner Violence: Not At Risk (11/10/2022)   Humiliation, Afraid, Rape, and Kick questionnaire    Fear of Current or Ex-Partner: No    Emotionally Abused: No    Physically Abused: No    Sexually Abused: No    Family History  Problem Relation Age of Onset   Heart failure Mother 40   Diabetes Mother    COPD Father     ROS: no fevers or chills, productive cough, hemoptysis, dysphasia, odynophagia, melena, hematochezia, dysuria, hematuria, rash, seizure activity, orthopnea, PND, pedal edema, claudication. Remaining systems are negative.  Physical Exam: Well-developed well-nourished in no acute distress.  Skin is warm and dry.  HEENT is normal.  Neck is supple.  Chest is clear to auscultation with normal expansion.  Cardiovascular exam is regular rate and rhythm.  Abdominal exam nontender or distended. No masses palpated. Extremities show no edema. neuro grossly intact   A/P  1 paroxysmal atrial fibrillation-patient remains in sinus rhythm.  Continue Tikosyn, Toprol and apixaban.  2 history of chronic combined systolic/diastolic congestive heart failure-LV function has improved on most recent echocardiogram.  Previous reduction likely tachycardia mediated.  Continue beta-blocker, ARB, Jardiance and Lasix.  3 hypertension-blood pressure controlled.  Continue present medical regimen.  4 history of chest pain-no recent symptoms.  5 obstructive sleep apnea-continue CPAP.  6 morbid obesity-patient was being considered for a GLP-1 agonist but apparently insurance would not cover.  We will review with our pharmacist to see if there is other options.  We discussed diet and exercise.  Olga Millers, MD

## 2022-12-13 ENCOUNTER — Other Ambulatory Visit (HOSPITAL_COMMUNITY): Payer: Self-pay

## 2022-12-14 NOTE — Progress Notes (Unsigned)
Patient ID: Justin Perez                 DOB: [birth year]                    MRN: [id number]     HPI: Justin Perez is a 47 y.o. male patient referred to pharmacy clinic by Anna Genre, PA to initiate GLP1-RA therapy for weight loss. PMH is significant for HTN, OSA, PAF, HFrEF with recovered EF, and obesity. Most recent BMI 56.9 kg/m2. At his last outpatient visit with HF clinic, he was started on Jardiance as part of his HF GDMT regimen. Patient would benefit from weight loss to further reduce cardiovascular morbidity and mortality risk.  Last A1c 5.5% (06/2018)  At today's visit...   Baseline weight and BMI: *** Current weight and BMI: *** Current meds that affect weight: none (SGLT2i? Not weight loss but A1c reduction? Idk haha. Low risk of hypoglycemia)  *** If diabetic and on insulin/sulfonylurea, can consider reducing dose to reduce risk of hypoglycemia  *** Follow-up visit  Assess % weight loss Assess adverse effects Missed doses  Diet:   Exercise:   Family History:  Mother-CHF, DM2  Social History:  EtOH-rarely on special occasions No tobacco or drug use   Labs: Lab Results  Component Value Date   HGBA1C 5.5 07/14/2018    Wt Readings from Last 1 Encounters:  11/26/22 (!) 418 lb 12.8 oz (190 kg)    BP Readings from Last 1 Encounters:  11/26/22 139/73   Pulse Readings from Last 1 Encounters:  11/26/22 (!) 53       Component Value Date/Time   CHOL 155 07/15/2018 0247   TRIG 95 07/15/2018 0247   HDL 32 (L) 07/15/2018 0247   CHOLHDL 4.8 07/15/2018 0247   VLDL 19 07/15/2018 0247   LDLCALC 104 (H) 07/15/2018 0247    Past Medical History:  Diagnosis Date   CHF (congestive heart failure) (HCC)    Hypertension    Morbid obesity with BMI of 50.0-59.9, adult (HCC)    OSA on CPAP     Current Outpatient Medications on File Prior to Visit  Medication Sig Dispense Refill   acetaminophen (TYLENOL) 500 MG tablet Take 1,000 mg by mouth as needed for  moderate pain.     dofetilide (TIKOSYN) 500 MCG capsule Take 1 capsule (500 mcg total) by mouth 2 (two) times daily. 180 capsule 1   ELIQUIS 5 MG TABS tablet Take 1 tablet (5 mg total) by mouth 2 (two) times daily. 60 tablet 3   empagliflozin (JARDIANCE) 10 MG TABS tablet Take 1 tablet (10 mg total) by mouth daily before breakfast. 30 tablet 3   famotidine (PEPCID) 20 MG tablet Take 20 mg by mouth as needed for heartburn or indigestion.     furosemide (LASIX) 20 MG tablet Take 1 tablet (20 mg total) by mouth daily as needed (swelling/wt gain). 30 tablet 1   losartan (COZAAR) 100 MG tablet Take 1 tablet (100 mg total) by mouth daily. 90 tablet 3   magnesium oxide (MAG-OX) 400 MG tablet Take 1 tablet (400 mg total) by mouth daily. 90 tablet 1   metoprolol succinate (TOPROL-XL) 25 MG 24 hr tablet Take 1 tablet (25 mg total) by mouth daily. 30 tablet 6   No current facility-administered medications on file prior to visit.    No Known Allergies   Assessment/Plan:  1. Weight loss - Patient has not met goal of at least  5% of body weight loss with comprehensive lifestyle modifications alone in the past 3-6 months. Pharmacotherapy is appropriate to pursue as augmentation. Will start ***. Confirmed patient not ***pregnant and no personal or family history of medullary thyroid carcinoma (MTC) or Multiple Endocrine Neoplasia syndrome type 2 (MEN 2). Injection technique reviewed at today's visit.  Advised patient on common side effects including nausea, diarrhea, dyspepsia, decreased appetite, and fatigue. Counseled patient on reducing meal size and how to titrate medication to minimize side effects. Counseled patient to call if intolerable side effects or if experiencing dehydration, abdominal pain, or dizziness. Patient will adhere to dietary modifications and will target at least 150 minutes of moderate intensity exercise weekly.   Follow up in 1 month via telephone for tolerability update and dose  titration.

## 2022-12-15 ENCOUNTER — Ambulatory Visit: Payer: 59

## 2022-12-15 ENCOUNTER — Telehealth: Payer: Self-pay | Admitting: Pharmacist

## 2022-12-15 ENCOUNTER — Other Ambulatory Visit (HOSPITAL_COMMUNITY): Payer: Self-pay

## 2022-12-15 ENCOUNTER — Telehealth: Payer: Self-pay | Admitting: Pharmacy Technician

## 2022-12-15 NOTE — Telephone Encounter (Signed)
-----   Message from Olene Floss sent at 12/13/2022  6:48 AM EDT ----- Please do PA for zepbound E66.9 obesity thanks

## 2022-12-15 NOTE — Telephone Encounter (Signed)
PA changed to [facility]-   Pharmacy Patient Advocate Encounter   Received notification from  staff messages  that prior authorization for wegovy is required/requested.   Insurance verification completed.   The patient is insured through [facility] .   Per test claim: PA required; PA submitted to [facility] via CoverMyMeds Key/confirmation #/EOC [facility] Status is pending

## 2022-12-15 NOTE — Telephone Encounter (Signed)
Zepbound/Wegovy are excluded benefits. Was unable to argue CV indication. Pt made aware insurance wont cover. He cannot afford the cash options. I did offer him to come in and discuss lifestyle options or he could cancel apt. Pt decided to cancel apt. I asked about his BP. States it is usually 130/80 or less.

## 2022-12-15 NOTE — Telephone Encounter (Signed)
Pharmacy Patient Advocate Encounter   Received notification from  staff messages  that prior authorization for zepbound is required/requested.   Insurance verification completed.   The patient is insured through [facility] .   Per test claim: PA required; PA submitted to [facility] via CoverMyMeds Key/confirmation #/EOC [id number] Status is pending

## 2022-12-15 NOTE — Telephone Encounter (Signed)
Pharmacy Patient Advocate Encounter  Received notification from [facility] that Prior Authorization for wegovy has been DENIED.  Media will be attached   PA #/Case ID/Reference #: [id number]

## 2022-12-17 ENCOUNTER — Encounter: Payer: Self-pay | Admitting: Cardiology

## 2022-12-17 ENCOUNTER — Ambulatory Visit: Payer: 59 | Attending: Cardiology | Admitting: Cardiology

## 2022-12-17 VITALS — BP 136/84 | HR 68 | Ht 72.0 in | Wt >= 6400 oz

## 2022-12-17 DIAGNOSIS — I5032 Chronic diastolic (congestive) heart failure: Secondary | ICD-10-CM | POA: Diagnosis not present

## 2022-12-17 DIAGNOSIS — I480 Paroxysmal atrial fibrillation: Secondary | ICD-10-CM | POA: Diagnosis not present

## 2022-12-17 DIAGNOSIS — Z6841 Body Mass Index (BMI) 40.0 and over, adult: Secondary | ICD-10-CM

## 2022-12-17 DIAGNOSIS — G4733 Obstructive sleep apnea (adult) (pediatric): Secondary | ICD-10-CM | POA: Diagnosis not present

## 2022-12-17 NOTE — Patient Instructions (Signed)

## 2022-12-24 ENCOUNTER — Ambulatory Visit (HOSPITAL_COMMUNITY)
Admission: RE | Admit: 2022-12-24 | Discharge: 2022-12-24 | Disposition: A | Discharge status: Home / Self Care | Payer: 59 | Source: Ambulatory Visit | Attending: Internal Medicine | Admitting: Internal Medicine

## 2022-12-24 VITALS — BP 120/84 | HR 61 | Ht 72.0 in | Wt >= 6400 oz

## 2022-12-24 DIAGNOSIS — I4819 Other persistent atrial fibrillation: Secondary | ICD-10-CM | POA: Diagnosis not present

## 2022-12-24 DIAGNOSIS — I5022 Chronic systolic (congestive) heart failure: Secondary | ICD-10-CM | POA: Diagnosis not present

## 2022-12-24 DIAGNOSIS — Z5181 Encounter for therapeutic drug level monitoring: Secondary | ICD-10-CM | POA: Diagnosis not present

## 2022-12-24 DIAGNOSIS — E669 Obesity, unspecified: Secondary | ICD-10-CM | POA: Insufficient documentation

## 2022-12-24 DIAGNOSIS — I110 Hypertensive heart disease with heart failure: Secondary | ICD-10-CM | POA: Insufficient documentation

## 2022-12-24 DIAGNOSIS — Z7901 Long term (current) use of anticoagulants: Secondary | ICD-10-CM | POA: Diagnosis not present

## 2022-12-24 DIAGNOSIS — Z6841 Body Mass Index (BMI) 40.0 and over, adult: Secondary | ICD-10-CM | POA: Insufficient documentation

## 2022-12-24 DIAGNOSIS — Z79899 Other long term (current) drug therapy: Secondary | ICD-10-CM | POA: Diagnosis not present

## 2022-12-24 DIAGNOSIS — D6869 Other thrombophilia: Secondary | ICD-10-CM | POA: Diagnosis not present

## 2022-12-24 LAB — BASIC METABOLIC PANEL
Anion gap: 7 (ref 5–15)
BUN: 12 mg/dL (ref 6–20)
CO2: 27 mmol/L (ref 22–32)
Calcium: 9.3 mg/dL (ref 8.9–10.3)
Chloride: 103 mmol/L (ref 98–111)
Creatinine, Ser: 0.9 mg/dL (ref 0.61–1.24)
GFR, Estimated: >60 mL/min (ref >60)
Glucose, Bld: 158 mg/dL — ABNORMAL HIGH (ref 70–99)
Potassium: 4.1 mmol/L (ref 3.5–5.1)
Sodium: 137 mmol/L (ref 135–145)

## 2022-12-24 LAB — MAGNESIUM: Magnesium: 1.9 mg/dL (ref 1.7–2.4)

## 2022-12-24 NOTE — Progress Notes (Signed)
Primary Care Physician: [facility] Primary Cardiologist: Olga Millers, MD Electrophysiologist: None     Referring Physician: [facility]     Justin Perez is a 47 y.o. male with a history of obesity, OSA, new systolic HF from echo TEE 10/17/22, HTN, and atrial fibrillation who presents for consultation in the [facility]. Symptoms of chest pain and SOB seen by PCP noted to be in Afib with RVR on 10/10/22. He was prescribed Lopressor 25 mg BID. Patient is on Eliquis for a CHADS2VASC score of 2.  On evaluation today 8/28, he is currently in Afib with RVR. He drinks 3 - 16 oz diet cokes daily. He drinks occasional alcohol. He does have sleep apnea but admits to sometimes falling asleep without CPAP mask on his face and will wake up gasping for air. He notes to have SOB and palpitations and lower extremity swelling and dizziness. He began full anticoagulation with Eliquis on 8/23.   On follow up 10/23/22, he is currently in NSR. He is s/p successful DCCV on 8/30. TEE with DCCV showed severely decreased biventricular systolic function. He is now compliant with CPAP. 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 He is trying to lose weight.  On follow up 11/10/22, patient is here for Tikosyn admission. No new medications since last OV. He stopped hydrochlorothiazide over 3 days prior to today's planned admission. No benadryl use. No missed doses of anticoagulant.   On follow up 11/20/22, he is currently in NSR. S/p Tikosyn admission 9/23-26. He did not require cardioversion. He was discharged on 500 mcg dose. He is very happy that repeat echo during hospital admission noted improvement in EF to 50-55%. Toprol decreased to 25 mg daily. He feels well overall. No missed doses of Tikosyn or Eliquis.   On follow up 12/24/22, he is currently in NSR. He is doing well overall. One brief episode of dizziness this morning  but quickly resolved after walking around the house. No episodes of Afib since last office visit. No missed doses of Tikosyn or Eliquis.   Today, he denies symptoms of chest pain, orthopnea, PND, presyncope, syncope, snoring, daytime somnolence, bleeding, or neurologic sequela. The patient is tolerating medications without difficulties and is otherwise without complaint today.    Atrial Fibrillation Risk Factors:  he does have symptoms or diagnosis of sleep apnea. he is compliant with CPAP therapy.  he has a BMI of Body mass index is 56.58 kg/m.Marland Kitchen Filed Weights   12/24/22 0927  Weight: (!) 189.2 kg     Current Outpatient Medications  Medication Sig Dispense Refill   acetaminophen (TYLENOL) 500 MG tablet Take 1,000 mg by mouth as needed for moderate pain.     dofetilide (TIKOSYN) 500 MCG capsule Take 1 capsule (500 mcg total) by mouth 2 (two) times daily. 180 capsule 1   ELIQUIS 5 MG TABS tablet Take 1 tablet (5 mg total) by mouth 2 (two) times daily. 60 tablet 3   empagliflozin (JARDIANCE) 10 MG TABS tablet Take 1 tablet (10 mg total) by mouth daily before breakfast. 30 tablet 3   famotidine (PEPCID) 20 MG tablet Take 20 mg by mouth as needed for heartburn or indigestion.     furosemide (LASIX) 20 MG tablet Take 1 tablet (20 mg total) by mouth daily as needed (swelling/wt gain). 30 tablet 1   losartan (COZAAR) 100 MG tablet Take 1 tablet (100 mg total) by mouth daily.  90 tablet 3   magnesium oxide (MAG-OX) 400 MG tablet Take 1 tablet (400 mg total) by mouth daily. 90 tablet 1   metoprolol succinate (TOPROL-XL) 25 MG 24 hr tablet Take 1 tablet (25 mg total) by mouth daily. 30 tablet 6   No current facility-administered medications for this encounter.    Atrial Fibrillation Management history:  Previous antiarrhythmic drugs: Tikosyn Previous cardioversions: 10/17/22 Previous ablations: None Anticoagulation history: Eliquis  ROS- All systems are reviewed and negative except as per  the HPI above.  Physical Exam: BP 120/84   Pulse 61   Ht 6' (1.829 m)   Wt (!) 189.2 kg   BMI 56.58 kg/m   GEN- The patient is well appearing, alert and oriented x 3 today.   Neck - no JVD or carotid bruit noted Lungs- Clear to ausculation bilaterally, normal work of breathing Heart- Regular rate and rhythm, no murmurs, rubs or gallops, PMI not laterally displaced Extremities- no clubbing, cyanosis, or edema Skin - no rash or ecchymosis noted   EKG today demonstrates  Vent. rate 61 BPM PR interval 150 ms QRS duration 96 ms QT/QTcB 414/416 ms P-R-T axes 63 119 79 Normal sinus rhythm Left posterior fascicular block Possible Anterior infarct , age undetermined Abnormal ECG When compared with ECG of 26-Nov-2022 09:15, PREVIOUS ECG IS PRESENT  Echo 10/17/22 TEE: 1. Left ventricular ejection fraction, by estimation, is 20 to 25%. The  left ventricle has severely decreased function.   2. Right ventricular systolic function is severely reduced. The right  ventricular size is moderately enlarged.   3. Left atrial size was severely dilated. No left atrial/left atrial  appendage thrombus was detected. The LAA emptying velocity was 43 cm/s.   4. Right atrial size was moderately dilated.   5. The mitral valve is normal in structure. Mild mitral valve  regurgitation. No evidence of mitral stenosis.   6. Tricuspid valve regurgitation is mild to moderate.   7. The aortic valve is tricuspid. Aortic valve regurgitation is not  visualized. No aortic stenosis is present.    ASSESSMENT & PLAN CHA2DS2-VASc Score = 2 The patient's score is based upon: CHF History: 1 HTN History: 1 Diabetes History: 0 Stroke History: 0 Vascular Disease History: 0 Age Score: 0 Gender Score: 0      ASSESSMENT AND PLAN: Persistent Atrial Fibrillation (ICD10:  I48.0) The patient's CHA2DS2-VASc score is 2, indicating a 2.2% annual risk of stroke.   S/p Tikosyn admission 9/23-26/24.  He is currently  in NSR. Qtc stable. Continue Tikosyn 500 mcg BID. Bmet and mag drawn today.    Secondary hypercoagulable state due to atrial fibrillation - Chads 2 Continue Eliquis without interruption.    F/u 3 months Tikosyn surveillance.    Lake Bells, PA-C  Afib Clinic [facility] [address] [place], [place] [address] [phone]

## 2023-03-26 ENCOUNTER — Encounter (HOSPITAL_COMMUNITY): Payer: Self-pay | Admitting: Internal Medicine

## 2023-03-26 ENCOUNTER — Ambulatory Visit (HOSPITAL_COMMUNITY)
Admission: RE | Admit: 2023-03-26 | Discharge: 2023-03-26 | Disposition: A | Discharge status: Home / Self Care | Payer: Commercial Managed Care - HMO | Source: Ambulatory Visit | Attending: Internal Medicine | Admitting: Internal Medicine

## 2023-03-26 VITALS — BP 136/88 | HR 63 | Ht 72.0 in | Wt >= 6400 oz

## 2023-03-26 DIAGNOSIS — D6869 Other thrombophilia: Secondary | ICD-10-CM | POA: Diagnosis not present

## 2023-03-26 DIAGNOSIS — Z7901 Long term (current) use of anticoagulants: Secondary | ICD-10-CM | POA: Insufficient documentation

## 2023-03-26 DIAGNOSIS — I5022 Chronic systolic (congestive) heart failure: Secondary | ICD-10-CM | POA: Diagnosis not present

## 2023-03-26 DIAGNOSIS — Z79899 Other long term (current) drug therapy: Secondary | ICD-10-CM | POA: Diagnosis not present

## 2023-03-26 DIAGNOSIS — I4819 Other persistent atrial fibrillation: Secondary | ICD-10-CM | POA: Diagnosis not present

## 2023-03-26 DIAGNOSIS — Z5181 Encounter for therapeutic drug level monitoring: Secondary | ICD-10-CM | POA: Insufficient documentation

## 2023-03-26 DIAGNOSIS — I110 Hypertensive heart disease with heart failure: Secondary | ICD-10-CM | POA: Insufficient documentation

## 2023-03-26 LAB — BASIC METABOLIC PANEL
Anion gap: 11 (ref 5–15)
BUN: 15 mg/dL (ref 6–20)
CO2: 23 mmol/L (ref 22–32)
Calcium: 9 mg/dL (ref 8.9–10.3)
Chloride: 104 mmol/L (ref 98–111)
Creatinine, Ser: 0.91 mg/dL (ref 0.61–1.24)
GFR, Estimated: >60 mL/min (ref >60)
Glucose, Bld: 113 mg/dL — ABNORMAL HIGH (ref 70–99)
Potassium: 3.9 mmol/L (ref 3.5–5.1)
Sodium: 138 mmol/L (ref 135–145)

## 2023-03-26 LAB — MAGNESIUM: Magnesium: 2.2 mg/dL (ref 1.7–2.4)

## 2023-03-26 NOTE — Progress Notes (Signed)
 Primary Care Physician: [facility] Primary Cardiologist: Redell Shallow, MD Electrophysiologist: None     Referring Physician: [facility]     Justin Perez is a 48 y.o. male with a history of obesity, OSA, new systolic HF from echo TEE 10/17/22, HTN, and atrial fibrillation who presents for consultation in the [facility]ibr[facility]. Symptoms of chest pain and SOB seen by PCP noted to be in Afib with RVR on 10/10/22. He was prescribed Lopressor  25 mg BID. Patient is on Eliquis  for a CHADS2VASC score of 2.  On evaluation today 8/28, he is currently in Afib with RVR. He drinks 3 - 16 oz diet cokes daily. He drinks occasional alcohol. He does have sleep apnea but admits to sometimes falling asleep without CPAP mask on his face and will wake up gasping for air. He notes to have SOB and palpitations and lower extremity swelling and dizziness. He began full anticoagulation with Eliquis  on 8/23.   On follow up 10/23/22, he is currently in NSR. He is s/p successful DCCV on 8/30. TEE with DCCV showed severely decreased biventricular systolic function. He is now compliant with CPAP. He has an Scientist, Physiological and has not noted any Afib since cardioversion. He is trying to lose weight.  On follow up 11/10/22, patient is here for Tikosyn  admission. No new medications since last OV. He stopped hydrochlorothiazide over 3 days prior to today's planned admission. No benadryl  use. No missed doses of anticoagulant.   On follow up 11/20/22, he is currently in NSR. S/p Tikosyn  admission 9/23-26. He did not require cardioversion. He was discharged on 500 mcg dose. He is very happy that repeat echo during hospital admission noted improvement in EF to 50-55%. Toprol  decreased to 25 mg daily. He feels well overall. No missed doses of Tikosyn  or Eliquis .   On follow up 12/24/22, he is currently in NSR. He is doing well overall. One brief episode of dizziness this morning  but quickly resolved after walking around the house. No episodes of Afib since last office visit. No missed doses of Tikosyn  or Eliquis .   On follow up 03/26/23, he is currently in NSR. He is here for Tikosyn  surveillance. He has had no episodes of Afib since last office visit. No missed doses of Tikosyn  500 or Eliquis .   Today, he denies symptoms of chest pain, orthopnea, PND, presyncope, syncope, snoring, daytime somnolence, bleeding, or neurologic sequela. The patient is tolerating medications without difficulties and is otherwise without complaint today.    Atrial Fibrillation Risk Factors:  he does have symptoms or diagnosis of sleep apnea. he is compliant with CPAP therapy.  he has a BMI of Body mass index is 56.91 kg/m.SABRA Filed Weights   03/26/23 0929  Weight: (!) 190.3 kg     Current Outpatient Medications  Medication Sig Dispense Refill   acetaminophen  (TYLENOL ) 500 MG tablet Take 1,000 mg by mouth as needed for moderate pain.     dofetilide  (TIKOSYN ) 500 MCG capsule Take 1 capsule (500 mcg total) by mouth 2 (two) times daily. 180 capsule 1   ELIQUIS  5 MG TABS tablet Take 1 tablet (5 mg total) by mouth 2 (two) times daily. 60 tablet 3   empagliflozin  (JARDIANCE ) 10 MG TABS tablet Take 1 tablet (10 mg total) by mouth daily before breakfast. 30 tablet 3   famotidine  (PEPCID ) 20 MG tablet Take 20 mg by mouth as needed for heartburn or indigestion.     furosemide  (  LASIX ) 20 MG tablet Take 1 tablet (20 mg total) by mouth daily as needed (swelling/wt gain). 30 tablet 1   losartan  (COZAAR ) 100 MG tablet Take 1 tablet (100 mg total) by mouth daily. 90 tablet 3   magnesium  oxide (MAG-OX) 400 MG tablet Take 1 tablet (400 mg total) by mouth daily. 90 tablet 1   metoprolol  succinate (TOPROL -XL) 25 MG 24 hr tablet Take 1 tablet (25 mg total) by mouth daily. 30 tablet 6   No current facility-administered medications for this encounter.    Atrial Fibrillation Management history:  Previous  antiarrhythmic drugs: Tikosyn  Previous cardioversions: 10/17/22 Previous ablations: None Anticoagulation history: Eliquis   ROS- All systems are reviewed and negative except as per the HPI above.  Physical Exam: BP 136/88   Pulse 63   Ht 6' (1.829 m)   Wt (!) 190.3 kg   BMI 56.91 kg/m   GEN- The patient is well appearing, alert and oriented x 3 today.   Neck - no JVD or carotid bruit noted Lungs- Clear to ausculation bilaterally, normal work of breathing Heart- Regular rate and rhythm, no murmurs, rubs or gallops, PMI not laterally displaced Extremities- no clubbing, cyanosis, or edema Skin - no rash or ecchymosis noted   EKG today demonstrates  Vent. rate 63 BPM PR interval 154 ms QRS duration 100 ms QT/QTcB 402/411 ms P-R-T axes 59 109 75 Normal sinus rhythm Rightward axis Pulmonary disease pattern Nonspecific T wave abnormality Abnormal ECG When compared with ECG of 24-Dec-2022 09:35, PREVIOUS ECG IS PRESENT  Echo 11/11/22: 1. Left ventricular ejection fraction, by estimation, is 50 to 55%. The  left ventricle has low normal function. Left ventricular endocardial  border not optimally defined to evaluate regional wall motion. The left  ventricular internal cavity size was  severely dilated. There is moderate asymmetric left ventricular  hypertrophy.   2. The inferior vena cava is normal in size with greater than 50%  respiratory variability, suggesting right atrial pressure of 3 mmHg.   3. This is a limited study technically difficult despite the use of  echo-contrast.   Comparison(s): Prior images reviewed side by side. LVEF has greatly  improved since 10/17/2022 TEE.    ASSESSMENT & PLAN CHA2DS2-VASc Score = 2 The patient's score is based upon: CHF History: 1 HTN History: 1 Diabetes History: 0 Stroke History: 0 Vascular Disease History: 0 Age Score: 0 Gender Score: 0      ASSESSMENT AND PLAN: Persistent Atrial Fibrillation (ICD10:  I48.0) The  patient's CHA2DS2-VASc score is 2, indicating a 2.2% annual risk of stroke.   S/p Tikosyn  admission 9/23-26/24.  He is currently in NSR.  High risk medication monitoring (ICD10: [id number]) Patient requires ongoing monitoring for anti-arrhythmic medication which has the potential to cause life threatening arrhythmias or AV block. Qtc stable. Continue Tikosyn  500 mcg BID. Bmet and mag drawn today.   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    F/u 3 months Tikosyn  surveillance.    Terra Pac, PA-C  Afib Clinic [facility] [address] [place], [place] [address] [phone]

## 2023-03-28 ENCOUNTER — Other Ambulatory Visit (HOSPITAL_COMMUNITY): Payer: Self-pay | Admitting: Physician Assistant

## 2023-05-26 ENCOUNTER — Other Ambulatory Visit (HOSPITAL_COMMUNITY): Payer: Self-pay | Admitting: Internal Medicine

## 2023-06-12 ENCOUNTER — Other Ambulatory Visit (HOSPITAL_COMMUNITY): Payer: Self-pay

## 2023-06-12 MED ORDER — DOFETILIDE 500 MCG PO CAPS: 500.0000 ug | ORAL_CAPSULE | Freq: Two times a day (BID) | ORAL | 1 refills | Status: DC

## 2023-06-21 ENCOUNTER — Other Ambulatory Visit: Payer: Self-pay | Admitting: Student

## 2023-06-24 NOTE — Telephone Encounter (Signed)
 This is a A-Fib clinic pt that is still being seen in there A-Fib clinic. Please address

## 2023-06-25 ENCOUNTER — Encounter (HOSPITAL_COMMUNITY): Payer: Self-pay | Admitting: Internal Medicine

## 2023-06-25 ENCOUNTER — Ambulatory Visit (HOSPITAL_COMMUNITY)
Admission: RE | Admit: 2023-06-25 | Discharge: 2023-06-25 | Disposition: A | Discharge status: Home / Self Care | Payer: Commercial Managed Care - HMO | Source: Ambulatory Visit | Attending: Internal Medicine | Admitting: Internal Medicine

## 2023-06-25 VITALS — BP 126/90 | HR 59 | Ht 72.0 in | Wt >= 6400 oz

## 2023-06-25 DIAGNOSIS — Z5181 Encounter for therapeutic drug level monitoring: Secondary | ICD-10-CM

## 2023-06-25 DIAGNOSIS — D6869 Other thrombophilia: Secondary | ICD-10-CM

## 2023-06-25 DIAGNOSIS — Z79899 Other long term (current) drug therapy: Secondary | ICD-10-CM | POA: Diagnosis not present

## 2023-06-25 DIAGNOSIS — I4819 Other persistent atrial fibrillation: Secondary | ICD-10-CM

## 2023-06-25 MED ORDER — METOPROLOL SUCCINATE ER 25 MG PO TB24: 25.0000 mg | ORAL_TABLET | Freq: Every day | ORAL | 2 refills | Status: AC

## 2023-06-25 NOTE — Progress Notes (Signed)
 Primary Care Physician: Barnetta Liberty, MD Primary Cardiologist: Alexandria Angel, MD Electrophysiologist: None     Referring Physician: [facility]     Justin Perez is a 48 y.o. male with a history of obesity, OSA, new systolic HF from echo TEE 10/17/22, HTN, and atrial fibrillation who presents for consultation in the [facility]. Symptoms of chest pain and SOB seen by PCP noted to be in Afib with RVR on 10/10/22. He was prescribed Lopressor  25 mg BID. Patient is on Eliquis  for a CHADS2VASC score of 2. S/p Tikosyn  admission 9/23-26/2024.  On follow up 06/25/23, he is here for Tikosyn  surveillance. He has had no episodes of Afib since last office visit. He has lots a few lbs since last office visit; he has increased his walking and decreased portion sizes which is great. No missed doses of Tikosyn  or Eliquis .   Today, he denies symptoms of chest pain, orthopnea, PND, presyncope, syncope, snoring, daytime somnolence, bleeding, or neurologic sequela. The patient is tolerating medications without difficulties and is otherwise without complaint today.    Atrial Fibrillation Risk Factors:  he does have symptoms or diagnosis of sleep apnea. he is compliant with CPAP therapy.  he has a BMI of Body mass index is 56.47 kg/m.Aaron Aas Filed Weights   06/25/23 0943  Weight: (!) 188.9 kg      Current Outpatient Medications  Medication Sig Dispense Refill   acetaminophen  (TYLENOL ) 500 MG tablet Take 1,000 mg by mouth as needed for moderate pain.     dofetilide  (TIKOSYN ) 500 MCG capsule Take 1 capsule (500 mcg total) by mouth 2 (two) times daily. 180 capsule 1   ELIQUIS  5 MG TABS tablet Take 1 tablet (5 mg total) by mouth 2 (two) times daily. 60 tablet 3   empagliflozin  (JARDIANCE ) 10 MG TABS tablet TAKE 1 TABLET(10 MG) BY MOUTH DAILY BEFORE BREAKFAST 90 tablet 2   famotidine  (PEPCID ) 20 MG tablet Take 20 mg by mouth as needed for heartburn or  indigestion.     furosemide  (LASIX ) 20 MG tablet Take 1 tablet (20 mg total) by mouth daily as needed (swelling/wt gain). 30 tablet 1   losartan  (COZAAR ) 100 MG tablet Take 1 tablet (100 mg total) by mouth daily. 90 tablet 3   magnesium  oxide (MAG-OX) 400 (240 Mg) MG tablet TAKE 1 TABLET(400 MG) BY MOUTH DAILY 90 tablet 2   metoprolol  succinate (TOPROL -XL) 25 MG 24 hr tablet Take 1 tablet (25 mg total) by mouth daily. 90 tablet 2   No current facility-administered medications for this encounter.    Atrial Fibrillation Management history:  Previous antiarrhythmic drugs: Tikosyn  Previous cardioversions: 10/17/22 Previous ablations: None Anticoagulation history: Eliquis   ROS- All systems are reviewed and negative except as per the HPI above.  Physical Exam: BP (!) 126/90   Pulse (!) 59   Ht 6' (1.829 m)   Wt (!) 188.9 kg   BMI 56.47 kg/m   GEN- The patient is well appearing, alert and oriented x 3 today.   Neck - no JVD or carotid bruit noted Lungs- Clear to ausculation bilaterally, normal work of breathing Heart- Regular rate and rhythm, no murmurs, rubs or gallops, PMI not laterally displaced Extremities- no clubbing, cyanosis, or edema Skin - no rash or ecchymosis noted   EKG today demonstrates  Vent. rate 59 BPM PR interval 158 ms QRS duration 92 ms QT/QTcB 384/380 ms P-R-T axes 58 105 75 Sinus bradycardia Rightward axis Low voltage QRS Nonspecific  T wave abnormality Abnormal ECG When compared with ECG of 26-Mar-2023 09:31, No significant change was found  Echo 11/11/22: 1. Left ventricular ejection fraction, by estimation, is 50 to 55%. The  left ventricle has low normal function. Left ventricular endocardial  border not optimally defined to evaluate regional wall motion. The left  ventricular internal cavity size was  severely dilated. There is moderate asymmetric left ventricular  hypertrophy.   2. The inferior vena cava is normal in size with greater than 50%   respiratory variability, suggesting right atrial pressure of 3 mmHg.   3. This is a limited study technically difficult despite the use of  echo-contrast.   Comparison(s): Prior images reviewed side by side. LVEF has greatly  improved since 10/17/2022 TEE.    ASSESSMENT & PLAN CHA2DS2-VASc Score = 2 The patient's score is based upon: CHF History: 1 HTN History: 1 Diabetes History: 0 Stroke History: 0 Vascular Disease History: 0 Age Score: 0 Gender Score: 0      ASSESSMENT AND PLAN: Persistent Atrial Fibrillation (ICD10:  I48.0) The patient's CHA2DS2-VASc score is 2, indicating a 2.2% annual risk of stroke.   S/p Tikosyn  admission 9/23-26/24.  He is currently in NSR.   High risk medication monitoring (ICD10: [id number]) Patient requires ongoing monitoring for anti-arrhythmic medication which has the potential to cause life threatening arrhythmias or AV block. Qtc stable. Continue Tikosyn  500 mcg BID. Bmet and mag drawn today.  Secondary hypercoagulable state due to atrial fibrillation  Continue Eliquis  5 mg BID without interruption.   Weight loss Commended on weight loss and encouraged to set small achievable goals to be consistent.   F/u 3 months Tikosyn  surveillance.    Minnie Amber, PA-C  Afib Clinic [facility] [address] [place], [place] [address] [phone]

## 2023-06-26 LAB — BASIC METABOLIC PANEL WITH GFR
BUN/Creatinine Ratio: 12 (ref 9–20)
BUN: 13 mg/dL (ref 6–24)
CO2: 23 mmol/L (ref 20–29)
Calcium: 9.7 mg/dL (ref 8.7–10.2)
Chloride: 102 mmol/L (ref 96–106)
Creatinine, Ser: 1.06 mg/dL (ref 0.76–1.27)
Glucose: 94 mg/dL (ref 70–99)
Potassium: 4.8 mmol/L (ref 3.5–5.2)
Sodium: 142 mmol/L (ref 134–144)
eGFR: 87 mL/min/{1.73_m2} (ref >59)

## 2023-06-26 LAB — MAGNESIUM: Magnesium: 2 mg/dL (ref 1.6–2.3)

## 2023-08-03 ENCOUNTER — Telehealth: Payer: Self-pay | Admitting: Cardiology

## 2023-08-03 ENCOUNTER — Emergency Department (HOSPITAL_COMMUNITY)

## 2023-08-03 ENCOUNTER — Other Ambulatory Visit: Payer: Self-pay

## 2023-08-03 ENCOUNTER — Emergency Department (HOSPITAL_COMMUNITY): Admission: EM | Admit: 2023-08-03 | Discharge: 2023-08-03 | Disposition: A | Discharge status: Home / Self Care

## 2023-08-03 ENCOUNTER — Encounter (HOSPITAL_COMMUNITY): Payer: Self-pay

## 2023-08-03 DIAGNOSIS — Z7901 Long term (current) use of anticoagulants: Secondary | ICD-10-CM | POA: Diagnosis not present

## 2023-08-03 DIAGNOSIS — R079 Chest pain, unspecified: Secondary | ICD-10-CM | POA: Diagnosis present

## 2023-08-03 DIAGNOSIS — I10 Essential (primary) hypertension: Secondary | ICD-10-CM | POA: Insufficient documentation

## 2023-08-03 DIAGNOSIS — Z79899 Other long term (current) drug therapy: Secondary | ICD-10-CM | POA: Diagnosis not present

## 2023-08-03 LAB — BASIC METABOLIC PANEL WITH GFR
Anion gap: 11 (ref 5–15)
BUN: 13 mg/dL (ref 6–20)
CO2: 22 mmol/L (ref 22–32)
Calcium: 9.5 mg/dL (ref 8.9–10.3)
Chloride: 103 mmol/L (ref 98–111)
Creatinine, Ser: 0.82 mg/dL (ref 0.61–1.24)
GFR, Estimated: >60 mL/min (ref >60)
Glucose, Bld: 97 mg/dL (ref 70–99)
Potassium: 4.1 mmol/L (ref 3.5–5.1)
Sodium: 136 mmol/L (ref 135–145)

## 2023-08-03 LAB — TROPONIN I (HIGH SENSITIVITY)
Troponin I (High Sensitivity): 51 ng/L — ABNORMAL HIGH (ref <18)
Troponin I (High Sensitivity): 46 ng/L — ABNORMAL HIGH (ref <18)

## 2023-08-03 LAB — CBC
HCT: 53.9 % — ABNORMAL HIGH (ref 39.0–52.0)
Hemoglobin: 18.1 g/dL — ABNORMAL HIGH (ref 13.0–17.0)
MCH: 30.8 pg (ref 26.0–34.0)
MCHC: 33.6 g/dL (ref 30.0–36.0)
MCV: 91.7 fL (ref 80.0–100.0)
Platelets: 187 10*3/uL (ref 150–400)
RBC: 5.88 MIL/uL — ABNORMAL HIGH (ref 4.22–5.81)
RDW: 13.2 % (ref 11.5–15.5)
WBC: 7 10*3/uL (ref 4.0–10.5)
nRBC: 0 % (ref 0.0–0.2)

## 2023-08-03 LAB — D-DIMER, QUANTITATIVE: D-Dimer, Quant: <0.27 ug{FEU}/mL (ref 0.00–0.50)

## 2023-08-03 MED ORDER — DOFETILIDE 500 MCG PO CAPS
500.0000 ug | ORAL_CAPSULE | Freq: Once | ORAL | Status: DC
  Filled 2023-08-03: qty 1

## 2023-08-03 NOTE — ED Triage Notes (Signed)
 Patient reports feeling pressure in his head when standing, sharp, stabbing chest pain intermittently over the weekend, and dull, achy chest pain on his way here. Hx of afib and HTN, takes tikosyn  and eliquis .

## 2023-08-03 NOTE — Telephone Encounter (Signed)
 Patient called in c/o Jabbing/spikey like heart pains that lasted 10-15 minutes each, and he had a total of about 10-15 episodes. This happened last week. He also has felt a bit fatigued. Also, on occasion, when standing, he feels a throbbing/pressure in his head that comes and goes (no actual headache though). Today he has had one episode of the stabbing chest pain. Denies shortness of breath, denies dizziness. Also denies blurred vision, slurred speech, and weakness in extremities. He has not missed any doses of Eliquis  and Tikosyn .   Consulted with DOD (Dr. Katheryne Pane) and it was advised that pt needs to go to ER/Call 911. Called patient and advised him to present to the nearest ER; he states he is working in [place]; he verbalized understanding.

## 2023-08-03 NOTE — Telephone Encounter (Signed)
 Patient is in the ER

## 2023-08-03 NOTE — Telephone Encounter (Signed)
 Pt c/o of Chest Pain: STAT if active (IN THIS MOMENT) CP, including tightness, pressure, jaw pain, shoulder/upper arm/back pain, SOB, nausea, and vomiting.  1. Are you having CP right now (tightness, pressure, or discomfort)? No  2. Are you experiencing any other symptoms (ex. SOB, nausea, vomiting, sweating)? No  3. How long have you been experiencing CP? Two weeks  4. Is your CP continuous or coming and going? Coming and going   5. Have you taken Nitroglycerin ? No   6. If CP returns before callback, please consider calling 911. ?

## 2023-08-03 NOTE — ED Provider Triage Note (Signed)
 Emergency Medicine Provider Triage Evaluation Note  Justin Perez , a 48 y.o. male  was evaluated in triage.  Pt complains of CP intermittent x 1 wk, Hx Afib, compliant with meds.  Review of Systems  Positive: CP Negative: N/V, fever, chills, SHOB, numbness, weakness  Physical Exam  BP (!) 158/92   Pulse 66   Temp 98.3 F (36.8 C)   Resp 20   Ht 6' (1.829 m)   Wt (!) 185.1 kg   SpO2 96%   BMI 55.33 kg/m  Gen:   Awake, no distress   Resp:  Normal effort  MSK:   Moves extremities without difficulty  Other:    Medical Decision Making  Medically screening exam initiated at 1:09 PM.  Appropriate orders placed.  Gwenyth Leo was informed that the remainder of the evaluation will be completed by another provider, this initial triage assessment does not replace that evaluation, and the importance of remaining in the ED until their evaluation is complete.  Labs and imaging ordered   Merryl Abraham 08/03/23 1310

## 2023-08-03 NOTE — ED Provider Notes (Signed)
 [place] [facility]MENT AT [facility] Provider Note   CSN: [id number] Arrival date & time: 08/03/23  1235     Patient presents with: Chest Pain   Justin Perez is a 48 y.o. male.   48 year old male presenting emergency department with chest pain.  Intermittent for a week.  No aggravating factors alleviating factors.  No lightheadedness, dizziness, palpitations.  No shortness of breath.  No fevers no chills.  Does drive to [place] every day for work, but otherwise low risk for PE/DVT he is on Eliquis  as well   Chest Pain      Prior to Admission medications   Medication Sig Start Date End Date Taking? Authorizing Provider  acetaminophen  (TYLENOL ) 500 MG tablet Take 1,000 mg by mouth as needed for moderate pain.    [provider]  dofetilide  (TIKOSYN ) 500 MCG capsule Take 1 capsule (500 mcg total) by mouth 2 (two) times daily. 06/12/23   Nathanel Bal, PA-C  ELIQUIS  5 MG TABS tablet Take 1 tablet (5 mg total) by mouth 2 (two) times daily. 10/15/22   Nathanel Bal, PA-C  empagliflozin  (JARDIANCE ) 10 MG TABS tablet TAKE 1 TABLET(10 MG) BY MOUTH DAILY BEFORE BREAKFAST 04/01/23   Lenise Quince, MD  famotidine  (PEPCID ) 20 MG tablet Take 20 mg by mouth as needed for heartburn or indigestion.    [provider]  furosemide  (LASIX ) 20 MG tablet Take 1 tablet (20 mg total) by mouth daily as needed (swelling/wt gain). 11/20/22   Nathanel Bal, PA-C  losartan  (COZAAR ) 100 MG tablet Take 1 tablet (100 mg total) by mouth daily. 11/02/18 06/25/23  Lenise Quince, MD  magnesium  oxide (MAG-OX) 400 (240 Mg) MG tablet TAKE 1 TABLET(400 MG) BY MOUTH DAILY 05/27/23   Nathanel Bal, PA-C  metoprolol  succinate (TOPROL -XL) 25 MG 24 hr tablet Take 1 tablet (25 mg total) by mouth daily. 06/25/23   Nathanel Bal, PA-C    Allergies: Patient has no known allergies.    Review of Systems  Cardiovascular:  Positive for chest pain.    Updated Vital Signs BP  (!) 185/66 (BP Location: Right Wrist)   Pulse 65   Temp 98.7 F (37.1 C) (Oral)   Resp 14   Ht 6' (1.829 m)   Wt (!) 185.1 kg   SpO2 98%   BMI 55.33 kg/m   Physical Exam Vitals and nursing note reviewed.  Constitutional:      General: He is not in acute distress.    Appearance: He is not toxic-appearing.   Cardiovascular:     Rate and Rhythm: Normal rate and regular rhythm.     Heart sounds: Normal heart sounds.  Pulmonary:     Effort: Pulmonary effort is normal.     Breath sounds: Normal breath sounds.   Musculoskeletal:     Right lower leg: No edema.     Left lower leg: No edema.   Skin:    General: Skin is warm and dry.     Capillary Refill: Capillary refill takes less than 2 seconds.   Neurological:     General: No focal deficit present.     (all labs ordered are listed, but only abnormal results are displayed) Labs Reviewed  CBC - Abnormal; Notable for the following components:      Result Value   RBC 5.88 (*)    Hemoglobin 18.1 (*)    HCT 53.9 (*)    All other components within normal limits  TROPONIN I (HIGH SENSITIVITY) - Abnormal; Notable for the following components:   Troponin I (High Sensitivity) 51 (*)    All other components within normal limits  TROPONIN I (HIGH SENSITIVITY) - Abnormal; Notable for the following components:   Troponin I (High Sensitivity) 46 (*)    All other components within normal limits  BASIC METABOLIC PANEL WITH GFR  D-DIMER, QUANTITATIVE    EKG: EKG Interpretation Date/Time:  Monday August 03 2023 12:42:19 EDT Ventricular Rate:  61 PR Interval:  152 QRS Duration:  98 QT Interval:  404 QTC Calculation: 406 R Axis:   130  Text Interpretation: Normal sinus rhythm Right axis deviation Low voltage QRS Cannot rule out Anterior infarct , age undetermined Abnormal ECG When compared with ECG of 25-Jun-2023 09:46, PREVIOUS ECG IS PRESENT Confirmed by Elise Guile [phone]) on 08/03/2023 6:37:26 PM  Radiology: Lenell Query Chest 2  View Result Date: 08/03/2023 CLINICAL DATA:  Chest pain EXAM: CHEST - 2 VIEW COMPARISON:  Jul 16, 2018 FINDINGS: The heart size and mediastinal contours are within normal limits. Both lungs are clear. The visualized skeletal structures are unremarkable. IMPRESSION: No active cardiopulmonary disease. Electronically Signed   By: Fredrich Jefferson M.D.   On: 08/03/2023 14:09     Procedures   Medications Ordered in the ED  dofetilide  (TIKOSYN ) capsule 500 mcg (500 mcg Oral Not Given 08/03/23 1959)    Clinical Course as of 08/03/23 2330  Mon Aug 03, 2023  1478 Per last cardiology note:  48 y.o. male with a history of obesity, OSA, new systolic HF from echo TEE 10/17/22, HTN, and atrial fibrillation [TY]  1838 EKG 12-Lead Appears to be normal sinus rhythm [TY]  1838 Troponin flat [TY]  2048 D-Dimer, Quant: <0.27 [TY]    Clinical Course User Index [TY] Rolinda Climes, DO                                 Medical Decision Making Well-appearing 48 year old male presenting the emergency department for intermittent chest pain.  He is morbidly obese, systolic heart failure, hypertension and A-fib on Eliquis  and Tikosyn .  Appears to be normal sinus rhythm currently.  Hypertensive.  Maintaining oxygen saturation on room air.  Not in distress.  Clinically euvolemic.  Chest x-ray without acute decompensated heart failure/pulmonary edema.  Troponin mildly elevated, but flat.  Case discussed with cardiology, recommending outpatient follow-up/stress test.  D-dimer also negative.  PE unlikely.  Not currently having chest pain.  Stable for discharge at this time.  Amount and/or Complexity of Data Reviewed Independent Historian:     Details: Family notes cardiology appointment not until the end of July External Data Reviewed:     Details: See ED course Labs: ordered. Decision-making details documented in ED Course. Radiology: ordered and independent interpretation performed.    Details: See  above ECG/medicine tests: independent interpretation performed. Decision-making details documented in ED Course.    Details: Sinus  Risk Prescription drug management. Decision regarding hospitalization. Diagnosis or treatment significantly limited by social determinants of health. Risk Details: Poor health literacy      Final diagnoses:  Chest pain, unspecified type    ED Discharge Orders          Ordered    Ambulatory referral to Cardiology       Comments: If you have not heard from the Cardiology office within the next 72 hours please call [phone].   08/03/23 2150  Rolinda Climes, DO 08/03/23 2330

## 2023-08-03 NOTE — Discharge Instructions (Signed)
 Please return if you develop fevers, chills, chest pain returns, shortness of breath, lightheadedness, passout or any new or worsening symptoms that are concerning to you.  Please follow-up with your cardiologist as soon as possible..  Please call to schedule an appointment.

## 2023-08-08 NOTE — Progress Notes (Unsigned)
  Cardiology Office Note:  .   Date:  08/08/2023  ID:  Justin Perez, DOB [birth year], MRN [id number] PCP: Larnell Hamilton, MD  [facility] Providers Cardiologist:  Redell Shallow, MD {  History of Present Illness: .   Justin Perez is a 48 y.o. 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  Lexiscan  07/2018 stress EF 39%, intermediate risk study, artifact felt to be related to body habitus. Admitted for A-fib RVR, TEE 09/2022 showing EF 20 to 25% with severe RV dysfunction, severe LA enlargement. Underwent DCCV, follow-up echocardiogram 10/2022 (most recent) with recovered EF.   Shortly after, started on Tikosyn   Has not had any documented recurrences  Social history      Patient with history of atrial fibrillation status post successful DCCV 09/2022, with recovered tachycardia mediated cardiomyopathy now on Tikosyn .  Patient recently seen in the emergency room with no admission August 03, 2023 for chest pain.  Reportedly case discussed with cardiology and advised outpatient workup with possible stress test.  Also notably hypertensive with systolics 185.  Chest pain  Persistent atrial fibrillation Status post DCCV 09/2022 Has been chronically managed on Tikosyn  with no documented recurrences. QTc Followed by A-fib clinic  Tachycardia mediated cardiomyopathy -Reportedly normal heart catheterization in 2011 -Lexiscan  2020 normal with body habitus artifact Diagnosed on TEE during RVR episode, follow-up limited echocardiogram showed recovered EF September 2024  OSA on CPAP  Hypertension  Morbid obesity  ROS: Denies: Chest pain, shortness of breath, orthopnea, peripheral edema, palpitations, decreased  exercise intolerance, fatigue, lightheadedness.   Studies Reviewed: .         Risk Assessment/Calculations:   {Does this patient have ATRIAL FIBRILLATION?[phone]} No BP recorded.  {Refresh Note OR Click here to enter BP  :1}***       Physical Exam:   VS:  There were no vitals taken for this visit.   Wt Readings from Last 3 Encounters:  08/03/23 (!) 408 lb (185.1 kg)  06/25/23 (!) 416 lb 6.4 oz (188.9 kg)  03/26/23 (!) 419 lb 9.6 oz (190.3 kg)    GEN: Well nourished, well developed in no acute distress NECK: No JVD; No carotid bruits CARDIAC: ***RRR, no murmurs, rubs, gallops RESPIRATORY:  Clear to auscultation without rales, wheezing or rhonchi  ABDOMEN: Soft, non-tender, non-distended EXTREMITIES:  No edema; No deformity   ASSESSMENT AND PLAN: .         {Are you ordering a CV Procedure (e.g. stress test, cath, DCCV, TEE, etc)?   Press F2        :[id number]}  Dispo: ***  Signed, Thom LITTIE Sluder, PA-C

## 2023-08-12 ENCOUNTER — Ambulatory Visit: Attending: Physician Assistant | Admitting: Cardiology

## 2023-08-12 ENCOUNTER — Encounter (HOSPITAL_COMMUNITY): Payer: Self-pay

## 2023-08-12 ENCOUNTER — Encounter: Payer: Self-pay | Admitting: Physician Assistant

## 2023-08-12 VITALS — BP 128/82 | HR 55 | Ht 72.0 in | Wt >= 6400 oz

## 2023-08-12 DIAGNOSIS — I4819 Other persistent atrial fibrillation: Secondary | ICD-10-CM

## 2023-08-12 DIAGNOSIS — G4733 Obstructive sleep apnea (adult) (pediatric): Secondary | ICD-10-CM

## 2023-08-12 DIAGNOSIS — I2089 Other forms of angina pectoris: Secondary | ICD-10-CM | POA: Diagnosis not present

## 2023-08-12 DIAGNOSIS — R079 Chest pain, unspecified: Secondary | ICD-10-CM

## 2023-08-12 DIAGNOSIS — I10 Essential (primary) hypertension: Secondary | ICD-10-CM

## 2023-08-12 DIAGNOSIS — D751 Secondary polycythemia: Secondary | ICD-10-CM

## 2023-08-12 DIAGNOSIS — Z6841 Body Mass Index (BMI) 40.0 and over, adult: Secondary | ICD-10-CM

## 2023-08-12 MED ORDER — LOSARTAN POTASSIUM 100 MG PO TABS: 100.0000 mg | ORAL_TABLET | Freq: Every day | ORAL | 3 refills | Status: AC

## 2023-08-12 NOTE — Patient Instructions (Addendum)
 Medication Instructions:   Your physician recommends that you continue on your current medications as directed. Please refer to the Current Medication list given to you today.   *If you need a refill on your cardiac medications before your next appointment, please call your pharmacy*  Lab Work:  PLEASE GO DOWN STAIRS  [facility]  FIRST FLOOR   ( GET OFF ELEVATORS WALK TOWARDS WAITING AREA LAB LOCATED BY PHARMACY): erythropoietin  iron panel    If you have labs (blood work) drawn today and your tests are completely normal, you will receive your results only by: MyChart Message (if you have MyChart) OR A paper copy in the mail If you have any lab test that is abnormal or we need to change your treatment, we will call you to review the results.  Testing/Procedures: PET STRESS TEST   Your physician has requested that you have an echocardiogram. Echocardiography is a painless test that uses sound waves to create images of your heart. It provides your doctor with information about the size and shape of your heart and how well your heart's chambers and valves are working. This procedure takes approximately one hour. There are no restrictions for this procedure. Please do NOT wear cologne, perfume, aftershave, or lotions (deodorant is allowed). Please arrive 15 minutes prior to your appointment time.  Please note: We ask at that you not bring children with you during ultrasound (echo/ vascular) testing. Due to room size and safety concerns, children are not allowed in the ultrasound rooms during exams. Our front office staff cannot provide observation of children in our lobby area while testing is being conducted. An adult accompanying a patient to their appointment will only be allowed in the ultrasound room at the discretion of the ultrasound technician under special circumstances. We apologize for any inconvenience.    Follow-Up: At [facility], you and your health needs are our  priority.  As part of our continuing mission to provide you with exceptional heart care, our providers are all part of one team.  This team includes your primary Cardiologist (physician) and Advanced Practice Providers or APPs (Physician Assistants and Nurse Practitioners) who all work together to provide you with the care you need, when you need it.  Your next appointment:   2 -3 month(s)  Provider:  SHENG HALEY PA-C     We recommend signing up for the patient portal called MyChart.  Sign up information is provided on this After Visit Summary.  MyChart is used to connect with patients for Virtual Visits (Telemedicine).  Patients are able to view lab/test results, encounter notes, upcoming appointments, etc.  Non-urgent messages can be sent to your provider as well.   To learn more about what you can do with MyChart, go to [email or web address].   Other Instructions

## 2023-08-13 ENCOUNTER — Ambulatory Visit
Admission: RE | Admit: 2023-08-13 | Discharge: 2023-08-13 | Disposition: A | Discharge status: Home / Self Care | Source: Ambulatory Visit | Attending: Cardiology | Admitting: Cardiology

## 2023-08-13 DIAGNOSIS — R7989 Other specified abnormal findings of blood chemistry: Secondary | ICD-10-CM | POA: Insufficient documentation

## 2023-08-13 DIAGNOSIS — I2089 Other forms of angina pectoris: Secondary | ICD-10-CM | POA: Diagnosis not present

## 2023-08-13 DIAGNOSIS — R079 Chest pain, unspecified: Secondary | ICD-10-CM

## 2023-08-13 LAB — ERYTHROPOIETIN: Erythropoietin: 11.1 m[IU]/mL (ref 2.6–18.5)

## 2023-08-13 MED ORDER — REGADENOSON 0.4 MG/5ML IV SOLN
INTRAVENOUS | Status: AC
  Filled 2023-08-13: qty 5

## 2023-08-13 MED ORDER — RUBIDIUM RB82 GENERATOR (RUBYFILL)
25.0000 | PACK | Freq: Once | INTRAVENOUS | Status: AC
  Administered 2023-08-13: 24.68 via INTRAVENOUS

## 2023-08-13 MED ORDER — RUBIDIUM RB82 GENERATOR (RUBYFILL)
25.0000 | PACK | Freq: Once | INTRAVENOUS | Status: AC
  Administered 2023-08-13: 24.72 via INTRAVENOUS

## 2023-08-13 MED ORDER — REGADENOSON 0.4 MG/5ML IV SOLN
0.4000 mg | Freq: Once | INTRAVENOUS | Status: AC
  Administered 2023-08-13: 0.4 mg via INTRAVENOUS
  Filled 2023-08-13: qty 5

## 2023-08-14 LAB — NM PET CT CARDIAC PERFUSION MULTI W/ABSOLUTE BLOODFLOW
MBFR: 3.27
Nuc Rest EF: 45 %
Nuc Stress EF: 47 %
Peak HR: 71 {beats}/min
Rest HR: 61 {beats}/min
Rest MBF: 0.55 ml/g/min
Rest Nuclear Isotope Dose: 24.7 mCi
SRS: 2
SSS: 6
ST Depression (mm): 0 mm
Stress MBF: 1.8 ml/g/min
Stress Nuclear Isotope Dose: 24.7 mCi
TID: 1.12

## 2023-08-17 ENCOUNTER — Ambulatory Visit: Payer: Self-pay | Admitting: Cardiology

## 2023-08-24 DIAGNOSIS — R079 Chest pain, unspecified: Secondary | ICD-10-CM

## 2023-08-24 DIAGNOSIS — D751 Secondary polycythemia: Secondary | ICD-10-CM

## 2023-08-25 NOTE — Telephone Encounter (Signed)
 [facility] called ., stating the patient is there to have labs drawn . Per the phlebotomist tech. , patient states someone called him yesterday to  come back  the lab and get labs.    RN informed that patient needs to complete labs from 07/12/23 - Iron, TIBC, Ferritin panel.   Order placed.

## 2023-08-26 ENCOUNTER — Ambulatory Visit: Payer: Self-pay | Admitting: Cardiology

## 2023-08-26 LAB — IRON,TIBC AND FERRITIN PANEL
Ferritin: 153 ng/mL (ref 30–400)
Iron Saturation: 41 % (ref 15–55)
Iron: 123 ug/dL (ref 38–169)
Total Iron Binding Capacity: 298 ug/dL (ref 250–450)
UIBC: 175 ug/dL (ref 111–343)

## 2023-08-27 NOTE — Progress Notes (Signed)
 Pt has been made aware of normal result and verbalized understanding.  jw

## 2023-08-31 ENCOUNTER — Other Ambulatory Visit (HOSPITAL_COMMUNITY): Payer: Self-pay | Admitting: *Deleted

## 2023-08-31 MED ORDER — ELIQUIS 5 MG PO TABS: 5.0000 mg | ORAL_TABLET | Freq: Two times a day (BID) | ORAL | 3 refills | Status: AC

## 2023-09-14 ENCOUNTER — Ambulatory Visit: Admitting: Cardiology

## 2023-09-24 ENCOUNTER — Ambulatory Visit (HOSPITAL_COMMUNITY)
Admission: RE | Admit: 2023-09-24 | Discharge: 2023-09-24 | Disposition: A | Discharge status: Home / Self Care | Source: Ambulatory Visit | Attending: Internal Medicine | Admitting: Internal Medicine

## 2023-09-24 VITALS — BP 150/80 | HR 71 | Ht 72.0 in | Wt >= 6400 oz

## 2023-09-24 DIAGNOSIS — Z5181 Encounter for therapeutic drug level monitoring: Secondary | ICD-10-CM

## 2023-09-24 DIAGNOSIS — Z79899 Other long term (current) drug therapy: Secondary | ICD-10-CM

## 2023-09-24 DIAGNOSIS — I4891 Unspecified atrial fibrillation: Secondary | ICD-10-CM | POA: Diagnosis not present

## 2023-09-24 DIAGNOSIS — I4811 Longstanding persistent atrial fibrillation: Secondary | ICD-10-CM | POA: Diagnosis not present

## 2023-09-24 NOTE — Progress Notes (Signed)
 Primary Care Physician: Larnell Hamilton, MD Primary Cardiologist: Redell Shallow, MD Electrophysiologist: None     Referring Physician: [facility]     Justin Perez is a 48 y.o. male with a history of obesity, OSA, new systolic HF from echo TEE 10/17/22 improved to 55% 11/11/22, HTN, and atrial fibrillation who presents for consultation in the [facility]. Symptoms of chest pain and SOB seen by PCP noted to be in Afib with RVR on 10/10/22. He was prescribed Lopressor  25 mg BID. Patient is on Eliquis  for a CHADS2VASC score of 2. S/p Tikosyn  admission 9/23-26/2024.  On follow up 09/24/23, patient is here for Tikosyn  surveillance. He is currently in NSR. Overall very low burden of Afib. No missed doses of Tikosyn  or Eliquis .   Today, he denies symptoms of chest pain, orthopnea, PND, presyncope, syncope, snoring, daytime somnolence, bleeding, or neurologic sequela. The patient is tolerating medications without difficulties and is otherwise without complaint today.    Atrial Fibrillation Risk Factors:  he does have symptoms or diagnosis of sleep apnea. he is compliant with CPAP therapy.  he has a BMI of Body mass index is 55.61 kg/m.SABRA Filed Weights   09/24/23 0935  Weight: (!) 186 kg       Current Outpatient Medications  Medication Sig Dispense Refill   acetaminophen  (TYLENOL ) 500 MG tablet Take 1,000 mg by mouth as needed for moderate pain.     dofetilide  (TIKOSYN ) 500 MCG capsule Take 1 capsule (500 mcg total) by mouth 2 (two) times daily. 180 capsule 1   ELIQUIS  5 MG TABS tablet Take 1 tablet (5 mg total) by mouth 2 (two) times daily. 60 tablet 3   empagliflozin  (JARDIANCE ) 10 MG TABS tablet TAKE 1 TABLET(10 MG) BY MOUTH DAILY BEFORE BREAKFAST 90 tablet 2   famotidine  (PEPCID ) 20 MG tablet Take 20 mg by mouth as needed for heartburn or indigestion.     furosemide  (LASIX ) 20 MG tablet Take 1 tablet (20 mg total) by mouth daily as  needed (swelling/wt gain). (Patient taking differently: Take 20 mg by mouth as needed (swelling/wt gain).) 30 tablet 1   losartan  (COZAAR ) 100 MG tablet Take 1 tablet (100 mg total) by mouth daily. 90 tablet 3   magnesium  oxide (MAG-OX) 400 (240 Mg) MG tablet TAKE 1 TABLET(400 MG) BY MOUTH DAILY 90 tablet 2   metoprolol  succinate (TOPROL -XL) 25 MG 24 hr tablet Take 1 tablet (25 mg total) by mouth daily. 90 tablet 2   No current facility-administered medications for this encounter.    Atrial Fibrillation Management history:  Previous antiarrhythmic drugs: Tikosyn  Previous cardioversions: 10/17/22 Previous ablations: None Anticoagulation history: Eliquis   ROS- All systems are reviewed and negative except as per the HPI above.  Physical Exam: BP (!) 150/80   Pulse 71   Ht 6' (1.829 m)   Wt (!) 186 kg   BMI 55.61 kg/m   GEN- The patient is well appearing, alert and oriented x 3 today.   Neck - no JVD or carotid bruit noted Lungs- Clear to ausculation bilaterally, normal work of breathing Heart- Regular rate and rhythm, no murmurs, rubs or gallops, PMI not laterally displaced Extremities- no clubbing, cyanosis, or edema Skin - no rash or ecchymosis noted   EKG today demonstrates  Vent. rate 71 BPM PR interval 152 ms QRS duration 98 ms QT/QTcB 406/441 ms P-R-T axes 55 111 59 Sinus rhythm with Premature atrial complexes with Abberant conduction Low voltage QRS Possible Anterior  infarct (cited on or before 03-Aug-2023) Abnormal ECG When compared with ECG of 03-Aug-2023 12:42, PACs are new  Echo 11/11/22: 1. Left ventricular ejection fraction, by estimation, is 50 to 55%. The  left ventricle has low normal function. Left ventricular endocardial  border not optimally defined to evaluate regional wall motion. The left  ventricular internal cavity size was  severely dilated. There is moderate asymmetric left ventricular  hypertrophy.   2. The inferior vena cava is normal in  size with greater than 50%  respiratory variability, suggesting right atrial pressure of 3 mmHg.   3. This is a limited study technically difficult despite the use of  echo-contrast.   Comparison(s): Prior images reviewed side by side. LVEF has greatly  improved since 10/17/2022 TEE.   CHA2DS2-VASc Score = 2  The patient's score is based upon: CHF History: 1 HTN History: 1 Diabetes History: 0 Stroke History: 0 Vascular Disease History: 0 Age Score: 0 Gender Score: 0       ASSESSMENT AND PLAN: Persistent Atrial Fibrillation (ICD10:  I48.0) The patient's CHA2DS2-VASc score is 2, indicating a 2.2% annual risk of stroke.   S/p Tikosyn  admission 9/23-26/24.  He is currently in NSR. We discussed stopping his Eliquis  - he is scheduled for repeat echo next week. If confirmation of normal EF, then will discontinue Eliquis  as he has a Chadsvasc score of 1 given recovery of systolic dysfunction.   Hypertension BP at home is in the 130s systolic. Continue to trend.  High risk medication monitoring (ICD10: [id number]) Patient requires ongoing monitoring for anti-arrhythmic medication which has the potential to cause life threatening arrhythmias or AV block. Qtc stable. Continue Tikosyn  500 mcg BID. Bmet and mag drawn today.   Secondary hypercoagulable state due to atrial fibrillation  No missed doses of Eliquis .   Weight loss He is striving to lose weight and doing exercise / careful diet.    F/u 6 months Tikosyn  surveillance.    Terra Pac, PA-C  Af[facility] [address] [place], [place] [address] [phone]

## 2023-09-25 ENCOUNTER — Ambulatory Visit (HOSPITAL_COMMUNITY): Payer: Self-pay | Admitting: Internal Medicine

## 2023-09-25 LAB — BASIC METABOLIC PANEL WITH GFR
BUN/Creatinine Ratio: 12 (ref 9–20)
BUN: 12 mg/dL (ref 6–24)
CO2: 21 mmol/L (ref 20–29)
Calcium: 9.3 mg/dL (ref 8.7–10.2)
Chloride: 101 mmol/L (ref 96–106)
Creatinine, Ser: 1.01 mg/dL (ref 0.76–1.27)
Glucose: 112 mg/dL — ABNORMAL HIGH (ref 70–99)
Potassium: 4.1 mmol/L (ref 3.5–5.2)
Sodium: 139 mmol/L (ref 134–144)
eGFR: 92 mL/min/1.73 (ref >59)

## 2023-09-25 LAB — MAGNESIUM: Magnesium: 1.9 mg/dL (ref 1.6–2.3)

## 2023-09-28 ENCOUNTER — Ambulatory Visit (HOSPITAL_COMMUNITY)
Admission: RE | Admit: 2023-09-28 | Discharge: 2023-09-28 | Disposition: A | Discharge status: Home / Self Care | Source: Ambulatory Visit | Attending: Cardiovascular Disease | Admitting: Cardiovascular Disease

## 2023-09-28 DIAGNOSIS — R079 Chest pain, unspecified: Secondary | ICD-10-CM | POA: Insufficient documentation

## 2023-09-28 LAB — ECHOCARDIOGRAM COMPLETE
Est EF: 50
S' Lateral: 4.07 cm

## 2023-09-28 MED ORDER — PERFLUTREN LIPID MICROSPHERE
1.0000 mL | INTRAVENOUS | Status: AC | PRN
  Administered 2023-09-28 (×2): 3 mL via INTRAVENOUS

## 2023-10-13 NOTE — Progress Notes (Unsigned)
 Cardiology Office Note:  .   Date:  10/14/2023  ID:  Justin Perez, DOB [birth year], MRN [id number] PCP: Larnell Hamilton, MD  [facility] Providers Cardiologist:  Redell Shallow, MD {  History of Present Illness: .   Justin Perez is a 48 y.o. 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  Lexiscan  07/2018 stress EF 39%, intermediate risk study, artifact felt to be related to body habitus. Admitted for A-fib RVR, TEE 09/2022 showing EF 20 to 25% with severe RV dysfunction, severe LA enlargement. Underwent DCCV, follow-up echocardiogram 10/2022 (most recent) with recovered EF.   Shortly after, started on Tikosyn   Has not had any documented recurrences  Chest pain 07/2023 seen in the ED with hypertensive urgency.  Minimally elevated troponins.  Reported atypical sharp pain.  Follow-up NM PET CT stress test low risk with normal flow reserve.  WMA felt to be artifact.  Echo normal.   Social history  He is a Emergency planning/management officer in [employer], Surveyor, quantity and works very long hours. No family history of MIs, does not smoke, drinks 2-3 beers per week. No drugs.       Patient with history of atrial fibrillation status post successful DCCV 09/2022, with recovered tachycardia mediated cardiomyopathy now on Tikosyn .  I had seen him in June 2025 for atypical chest pain that he described as sharp and occurring at random.  Nonexertional.  He had reassuring NM PET stress and echocardiogram.  Also recently seen by A-fib clinic 09/2023.  There was discussion of stopping Eliquis  since CHA2DS2-VASc is 1 and he has had recovery of his heart failure.  Today patient presents for follow-up.  He reports another episode of sharp chest  pain that occurred yesterday.  Still no clear exertional component.  But otherwise he has no other acute complaints.  He reports some stress with his job as he works very long hours and has to drive to [place] and back each day.  Reported some dependent peripheral edema since he is on his feet all day.  Finds benefit with compression stockings but does not regularly use these.  He is making stronger efforts to lose weight and trying to walk more often and healthier.  His insurance company had recommended he discussed with the provider about stepdown.  ROS: Denies: Chest pain, shortness of breath, orthopnea, peripheral edema, palpitations, decreased exercise intolerance, fatigue, lightheadedness.   Studies Reviewed: .         Risk Assessment/Calculations:    CHA2DS2-VASc Score = 2   This indicates a 2.2% annual risk of stroke. The patient's score is based upon: CHF History: 1 HTN History: 1 Diabetes History: 0 Stroke History: 0 Vascular Disease History: 0 Age Score: 0 Gender Score: 0  Physical Exam:   VS:  BP 108/72 (BP Location: Left Arm, Patient Position: Sitting, Cuff Size: Large)   Pulse (!) 57   Ht 5' 11 (1.803 m)   Wt (!) 409 lb (185.5 kg)   SpO2 92%   BMI 57.04 kg/m    Wt Readings from Last 3 Encounters:  10/14/23 (!) 409 lb (185.5 kg)  09/24/23 (!) 410 lb (186 kg)  08/12/23 (!) 415 lb 3.2 oz (188.3 kg)    GEN: Well nourished, well developed in no acute distress NECK: No JVD;  No carotid bruits CARDIAC: RRR, no murmurs, rubs, gallops RESPIRATORY:  Clear to auscultation without rales, wheezing or rhonchi  ABDOMEN: Soft, non-tender, non-distended EXTREMITIES:  No edema; No deformity   ASSESSMENT AND PLAN: .    Atypical chest pain  - Normal PET CT stress test/echocardiogram 07/2023 Reporting nonexertional, sharp chest pain.  Etiologies may be related to MSK versus GERD.  Workup is reassuring that it is not likely related to cardiac etiologies.  Persistent atrial  fibrillation Status post DCCV 09/2022 Has been chronically managed on Tikosyn  with no documented recurrences.  A-fib clinic was considering stopping his Eliquis , he has a CHA2DS2-VASc of 2 for CHF history and hypertension.  Has multiple risk factors for recurrence.  I would be in favor of continuing, and patient is in agreement.  He is able to afford Eliquis  and has had no bleeding issues. Continue with Eliquis  5 mg twice daily, Tikosyn  500 mcg twice daily, Toprol -XL 25 mg Continue with magnesium  400 supplementation. Followed by A-fib clinic.   Tachycardia mediated cardiomyopathy with recovered EF - Reportedly normal heart catheterization in 2011 - Diagnosed on TEE 09/2022 during RVR episode, EF 20 to 25%.  - After DCCV, limited echocardiogram showed recovered EF 10/2022-09/2023 Demonstrates no CHF symptoms, mild peripheral edema at times that is likely dependent edema.  Euvolemic. Continue Jardiance  10 mg, losartan  100 mg, Toprol -XL 25 mg. Infrequently needs his as needed Lasix  20 mg.  Encouraged compression stockings.   OSA on CPAP Still needs to follow-up with [facility] to ensure proper function, may have leak.  He tried calling, encouraged him to try again.  Compliant with this otherwise.     Hypertension At target goal 108/72 Continue with losartan  100 mg, Toprol -XL 25 mg   Morbid obesity His BMI is 57.  He is making better efforts to work on his diet and exercise. Insurance did not cover GLP-1 2024 He said insurance would cover Zepbound, I think this is a good idea and would defer to his PCP to initiate.  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  Monitor annually    Dispo: Follow-up in 6 months with Dr. Pietro.  Signed, Thom LITTIE Sluder, PA-C

## 2023-10-14 ENCOUNTER — Encounter: Payer: Self-pay | Admitting: Nurse Practitioner

## 2023-10-14 ENCOUNTER — Ambulatory Visit: Admitting: Nurse Practitioner

## 2023-10-14 VITALS — BP 108/72 | HR 57 | Ht 71.0 in | Wt >= 6400 oz

## 2023-10-14 DIAGNOSIS — I4819 Other persistent atrial fibrillation: Secondary | ICD-10-CM | POA: Diagnosis not present

## 2023-10-14 DIAGNOSIS — G4733 Obstructive sleep apnea (adult) (pediatric): Secondary | ICD-10-CM

## 2023-10-14 DIAGNOSIS — R079 Chest pain, unspecified: Secondary | ICD-10-CM

## 2023-10-14 DIAGNOSIS — I5032 Chronic diastolic (congestive) heart failure: Secondary | ICD-10-CM | POA: Diagnosis not present

## 2023-10-14 DIAGNOSIS — I10 Essential (primary) hypertension: Secondary | ICD-10-CM

## 2023-10-14 NOTE — Patient Instructions (Signed)
 Medication Instructions:  Your physician recommends that you continue on your current medications as directed. Please refer to the Current Medication list given to you today.  *If you need a refill on your cardiac medications before your next appointment, please call your pharmacy*  Lab Work: NONE ordered at this time of appointment   Testing/Procedures: NONE ordered at this time of appointment   Follow-Up: At [facility], you and your health needs are our priority.  As part of our continuing mission to provide you with exceptional heart care, our providers are all part of one team.  This team includes your primary Cardiologist (physician) and Advanced Practice Providers or APPs (Physician Assistants and Nurse Practitioners) who all work together to provide you with the care you need, when you need it.  Your next appointment:   6 month(s)  Provider:   Alexandria Angel, MD    We recommend signing up for the patient portal called "MyChart".  Sign up information is provided on this After Visit Summary.  MyChart is used to connect with patients for Virtual Visits (Telemedicine).  Patients are able to view lab/test results, encounter notes, upcoming appointments, etc.  Non-urgent messages can be sent to your provider as well.   To learn more about what you can do with MyChart, go to [email or web address].

## 2023-10-30 ENCOUNTER — Other Ambulatory Visit: Payer: Self-pay | Admitting: Medical Genetics

## 2023-11-10 ENCOUNTER — Ambulatory Visit: Admitting: Neurology

## 2023-11-10 ENCOUNTER — Other Ambulatory Visit: Payer: Self-pay

## 2023-11-10 ENCOUNTER — Encounter: Payer: Self-pay | Admitting: Neurology

## 2023-11-10 VITALS — BP 139/69 | HR 89 | Ht 72.0 in | Wt >= 6400 oz

## 2023-11-10 DIAGNOSIS — G4733 Obstructive sleep apnea (adult) (pediatric): Secondary | ICD-10-CM | POA: Diagnosis not present

## 2023-11-10 DIAGNOSIS — Z6841 Body Mass Index (BMI) 40.0 and over, adult: Secondary | ICD-10-CM

## 2023-11-10 DIAGNOSIS — I480 Paroxysmal atrial fibrillation: Secondary | ICD-10-CM | POA: Diagnosis not present

## 2023-11-10 DIAGNOSIS — G4734 Idiopathic sleep related nonobstructive alveolar hypoventilation: Secondary | ICD-10-CM

## 2023-11-10 DIAGNOSIS — E662 Morbid (severe) obesity with alveolar hypoventilation: Secondary | ICD-10-CM | POA: Insufficient documentation

## 2023-11-10 MED ORDER — ALPRAZOLAM 0.5 MG PO TABS: 0.5000 mg | ORAL_TABLET | Freq: Every evening | ORAL | 0 refills | Status: AC | PRN

## 2023-11-10 NOTE — Patient Instructions (Signed)
 Sleep Apnea  Sleep apnea is a condition that affects your breathing while you are sleeping. Your tongue or soft tissue in your throat may block the flow of air while you sleep. You may have shallow breathing or stop breathing for short periods of time. People with sleep apnea may snore loudly. There are three kinds of sleep apnea: Obstructive sleep apnea. This kind is caused by a blocked or collapsed airway. This is the most common. Central sleep apnea. This kind happens when the part of the brain that controls breathing does not send the correct signals to the muscles that control breathing. Mixed sleep apnea. This is a combination of obstructive and central sleep apnea. What are the causes? The most common cause of sleep apnea is a collapsed or blocked airway. What increases the risk? Being very overweight. Having family members with sleep apnea. Having a tongue or tonsils that are larger than normal. Having a small airway or jaw problems. Being older. What are the signs or symptoms? Loud snoring. Restless sleep. Trouble staying asleep. Being sleepy or tired during the day. Waking up gasping or choking. Having a headache in the morning. Mood swings. Having a hard time remembering things and concentrating. How is this diagnosed? A medical history. A physical exam. A sleep study. This is also called a polysomnography test. This test is done at a sleep lab or in your home while you are sleeping. How is this treated? Treatment may include: Sleeping on your side. Losing weight if you're overweight. Wearing an oral appliance. This is a mouthpiece that moves your lower jaw forward. Using a positive airway pressure (PAP) device to keep your airways open while you sleep, such as: A continuous positive airway pressure (CPAP) device. This device gives forced air through a mask when you breathe out. This keeps your airways open. A bilevel positive airway pressure (BIPAP) device. This device  gives forced air through a mask when you breathe in and when you breathe out to keep your airways open. Having surgery if other treatments do not work. If your sleep apnea is not treated, you may be at risk for: Heart failure. Heart attack. Stroke. Type 2 diabetes or a problem with your blood sugar called insulin  resistance. Follow these instructions at home: Medicines Take your medicines only as told by your health care provider. Avoid alcohol, medicines to help you relax, and certain pain medicines. These may make sleep apnea worse. General instructions Do not smoke, vape, or use products with nicotine or tobacco in them. If you need help quitting, talk with your provider. If you were given a PAP device to open your airway while you sleep, use it as told by your provider. If you're having surgery, make sure to tell your provider you have sleep apnea. You may need to bring your PAP device with you. Contact a health care provider if: The PAP device that you were given to use during sleep bothers you or does not seem to be working. You do not feel better or you feel worse. Get help right away if: You have trouble breathing. You have chest pain. You have trouble talking. One side of your body feels weak. A part of your face is hanging down. These symptoms may be an emergency. Call 911 right away. Do not wait to see if the symptoms will go away. Do not drive yourself to the hospital. This information is not intended to replace advice given to you by your health care provider. Make sure  you discuss any questions you have with your health care provider. Document Revised: 11/06/2022 Document Reviewed: 04/10/2022 Elsevier Patient Education  2024 [employer] Inc.  My Plan is to proceed with:  The patient considers himself CPAP dependent he cannot even take a nap without CPAP.  It would be very hard to get him through a baseline study but be probably half to get at least 2 hours of baseline  sleep to document the severity of apnea.  My goal would be to have him examined in the sleep lab with a split-night polysomnography for which I will provide a sleep aid.  The goal is to establish the baseline apnea hypopnea index over the first 2 hours of the night then titrated to CPAP or BiPAP accordingly.  We also can use this time to refit for a mask as his current air leaks are very high.  He also has facial hair which does interfere with his CPAP seal.  So I will ask him to try to closely shave.  Should [place] healthcare not allow us  to do a split-night polysomnography we will have to resort to a home sleep test.  Again the patient has documented atrial fibrillation, had congestive heart failure, ejection fraction last year reportedly between 20 and 25%.  He does have prediabetes and is at high risk of obesity hypoventilation.     I plan to follow up personally or through our NP within 4 months.   A total time of  45  minutes consistent of a part of face to face encounter , exam and interview,  and additional preparation time for chart review was spent .  At today's visit, we discussed treatment options, associated risk and benefits, and engage in counseling as needed including, but not limited to:  Sleep hygiene, Quality Sleep Habits, and Safety concerns for patients with daytime sleepiness who are warned to not operate machinery/ motor vehicles when drowsy.  Additionally, the following were reviewed: Past medical records, past medical and surgical history, family and social background, as well as relevant laboratory results, imaging findings, and medical notes, where applicable.  This note was generated by myself in part by using dictation software, and as a result, it may contain unintentional typos and errors.  Nevertheless, effort was made to accurately convey the pertinent aspects of the patient's visit.   Dedra Gores, MD

## 2023-11-10 NOTE — Progress Notes (Signed)
 @[facility]   Provider:  Dedra Gores, MD    Primary Care Physician:  Larnell Hamilton, MD [address] [place] [address]   Referring Provider: Larnell Hamilton, Md [address] [place],  [place] [address]        Chief Concern for this Consultation:   Patient presents with     BMI over 55, atrial fib dx in 2024, and needs to loose weight, ESS: 18 and FSS : at 43/ 63 points     HPI: I have the pleasure of meeting with Justin Justin Perez, on 11-10-2023, who is a 48 y.o.  male patient,  seen upon a referral by Dr Pietro and Dr Larnell, for a  Sleep Medicine Consultation.  The patient's referral information asked for a testing of OSA and help to obtain potential zepbound.  .  Chief concern according to patient:  Justin Perez presented  has a past medical history of CHF (congestive heart failure) (HCC), Hypertension, Morbid obesity with BMI of 50.0-59.9, adult (HCC), and OSA on CPAP.SABRA  Sleep relevant medical/ surgical and symptom history:  atrial fibrillation  dx last year with a temporary reduction in EF to 25 %,  ENT surgery as a child : Tonsillectomy, GERD,  pre diabetic /endocrinological disorders, Depression.   This patient had a previous sleep study/ studies in the year 2017 at [facility] with a resulting diagnosis of OSA. This patient has used the following therapies: on CPAP now ,  This gentleman was set up with his CPAP on Jul 14, 2017, he is 100% compliant user of his current CPAP on average 6 hours and 59 minutes.  This machine has still manufacturer settings between 4 and 28 without EPR, his residual AHI is 1.8/h and the 95th percentile pressure is 12.3 cm water.  Air leaks are high: the 95th percentile is 41 L/min.    Family medical history: There are no known  biological family members affected by Sleep apnea (  father had stage 4 COPD, Mother had CHF with DM, vasculopathy, peripheral arterial disease,).     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 6 Pm to 2 AM.  The workplace involves physical activity, commuter travel.  Nicotine use: /.  ETOH use: /,  Caffeine intake in form of: Coffee carylon ), Soft drinks (diet soda ,  2 a day), Tea ( /) or Energy drinks ( including those containing  taurine ). Caffeine is last consumed at dinner .  Exercises regularly:  in form of walking.     Sleep habits and routines are as follows: The patient's dinner time is around 5- 6 PM.  Evening time is spent by home work and TV. The patient goes to bed at, or close to, 9 PM. The bedroom is shared with spouse  and is described as cool, quiet, and dark. The patient reports that it takes 15 minutes to fall asleep, then continues to sleep for 6-8 hours, uninterrupted or woken up by the need to void (Nocturia) one time 3 AM .   The preferred sleep position is variable , right lateral ,  with support of 2 pillows, (non- adjustable bed/ recliner ). The total estimated sleep time is circa 6-8  hours.  Dreams are reportedly  infrequent/ and can be vivid. Dream enactment has not been reported.   5 AM is the usual week- day rise time. The patient wakes up spontaneously and leaves at 5.45 AM.   He reports rarely feeling refreshed and restored in the morning, waking with symptoms such as dry mouth, rare morning headaches, legs stiffness or knee pain, and fatigue.   No sleep paralysis has been experienced.  Naps in daytime are taken infrequently (there is a desire to nap and the opportunity), on CPAP lasting from 1-3  hours and have a refreshing quality. These do not interfere with nocturnal sleep.     Review of Systems: Out of a complete 14 system review, the patient complains of only the following symptoms, and all other reviewed systems are  negative.:  Hypersomnia, EDS 18    Insomnia  Depression/ anxiety:  Pain:  Headaches     Snoring, Sleep fragmentation, Nocturia   How likely are you to doze in the following situations: 0 = not likely, 1 = slight chance, 2 = moderate chance, 3 = high chance Sitting and Reading? Watching Television? Sitting inactive in a public place (theater or meeting)? As a passenger in a car for an hour without a break? Lying down in the afternoon when circumstances permit? Sitting and talking to someone? Sitting quietly after lunch without alcohol? In a car, while stopped for a few minutes in traffic?   Total ESS =18 / 24 points.    FSS endorsed at 43/ 63 points.   GDS:  Social History   Socioeconomic History   Marital status: Married    Spouse name: Rachael   Number of children: 3   Years of education: Not on file   Highest education level: Associate degree: occupational, Scientist, product/process development, or vocational program  Occupational History   Occupation: [employer] representative  Tobacco Use   Smoking status: Never   Smokeless tobacco: Never   Tobacco comments:    Never smoked 11/20/22  Vaping Use   Vaping status: Never Used  Substance and Sexual Activity   Alcohol use: Yes    Comment: occ   Drug use: Never   Sexual activity: Not on file  Other Topics Concern   Not on file  Social History Narrative   Not on file   Social Drivers of Health   Financial Resource Strain: Low Risk  (11/11/2022)   Overall Financial Resource Strain (CARDIA)    Difficulty of Paying Living Expenses: Not very hard  Food Insecurity: No Food Insecurity (11/10/2022)   Hunger Vital Sign    Worried About Running Out of Food in the Last Year: Never true    Ran Out of Food in the Last Year: Never true  Transportation Needs: No Transportation Needs (11/10/2022)   PRAPARE - Administrator, Civil Service (Medical): No    Lack of Transportation (Non-Medical): No  Physical Activity: Not on file  Stress: Not on file   Social Connections: Not on file    Family History  Problem Relation Age of Onset   Heart failure Mother 45   Diabetes Mother    COPD Father     Past Medical History:  Diagnosis Date   CHF (congestive heart failure) (HCC)    Hypertension    Morbid obesity with BMI of 50.0-59.9, adult (HCC)    OSA on CPAP     Past Surgical History:  Procedure Laterality Date   biceps surgery     CARDIOVERSION N/A  10/17/2022   Procedure: CARDIOVERSION;  Surgeon: Kate Lonni CROME, MD;  Location: [facility] INVASIVE CV LAB;  Service: Cardiovascular;  Laterality: N/A;   TEE WITHOUT CARDIOVERSION N/A 10/17/2022   Procedure: TRANSESOPHAGEAL ECHOCARDIOGRAM;  Surgeon: Kate Lonni CROME, MD;  Location: [facility] INVASIVE CV LAB;  Service: Cardiovascular;  Laterality: N/A;   TONSILLECTOMY     URETHRA SURGERY       Current Outpatient Medications on File Prior to Visit  Medication Sig Dispense Refill   acetaminophen  (TYLENOL ) 500 MG tablet Take 1,000 mg by mouth as needed for moderate pain.     dofetilide  (TIKOSYN ) 500 MCG capsule Take 1 capsule (500 mcg total) by mouth 2 (two) times daily. 180 capsule 1   ELIQUIS  5 MG TABS tablet Take 1 tablet (5 mg total) by mouth 2 (two) times daily. 60 tablet 3   empagliflozin  (JARDIANCE ) 10 MG TABS tablet TAKE 1 TABLET(10 MG) BY MOUTH DAILY BEFORE BREAKFAST 90 tablet 2   famotidine  (PEPCID ) 20 MG tablet Take 20 mg by mouth as needed for heartburn or indigestion.     furosemide  (LASIX ) 20 MG tablet Take 1 tablet (20 mg total) by mouth daily as needed (swelling/wt gain). (Patient taking differently: Take 20 mg by mouth as needed (swelling/wt gain).) 30 tablet 1   losartan  (COZAAR ) 100 MG tablet Take 1 tablet (100 mg total) by mouth daily. 90 tablet 3   magnesium  oxide (MAG-OX) 400 (240 Mg) MG tablet TAKE 1 TABLET(400 MG) BY MOUTH DAILY 90 tablet 2   metoprolol  succinate (TOPROL -XL) 25 MG 24 hr tablet Take 1 tablet (25 mg total) by mouth daily. 90 tablet 2   No current  facility-administered medications on file prior to visit.    No Known Allergies  Vitals:   11/10/23 0842  BP: 139/69  Pulse: 89    Physical exam:   General: The patient was alert and appears not in acute distress.  Mood and affect are appropriate .  The patient's interactions are: Cooperative, makes eye contact, follows the instructions and answers questions coherently.  The patient is groomed and appropriately groomed and dressed. Head: Normocephalic, atraumatic.  Neck is supple. Mallampati: 5 - or 3 plus by the old classification. .  The neck circumference measured 23 inches. Nasal airflow was not fully patent ,   Crowding was noted.  Dental status: biological  Cardiovascular:  Regular rate and cardiac rhythm by palpable pulse. Respiratory: no audible wheezing, no tachypnoea.   Skin:  Without evidence of ankle edema. No discoloration.  Trunk:  BMI is 55.33  The patient's posture was erect.   Neurologic exam : The patient was awake and alert, oriented to place and time.   Attention span & concentration ability appeared normal.  Speech was fluent, without dysarthria, dysphonia or aphasia, and of normal volume.     Cranial nerves:  There was no loss of smell or taste reported  Pupils are round, equal in size and briskly reactive to light.  Funduscopic exam was deferred.  Extraocular movements in vertical and horizontal planes were intact and without nystagmus. (No Diplopia reported). Visual fields by finger perimetry are intact. Hearing was intact to soft voice.    Facial sensation intact to fine touch.  Facial motor strength: Symmetric movement and tongue and uvula move midline.  Neck ROM: rotation, tilt and flexion extension were intact for age and shoulder shrug was symmetrical.    Motor exam:  Symmetric bulk, strength and ROM.   Normal tone without cog- wheeling, and symmetric  grip strength. Patella crackling with movement.    Sensory:  Fine touch and vibration  were tested by tuning fork and intact.  Proprioception tested in the upper extremities was normal.   Coordination: The patient reported no problems with button closure and no changes to penmanship.   The Finger-to-nose maneuver was intact without evidence of ataxia, dysmetria or tremor.   Gait and station: Patient could rise unassisted from a seated position,  without bracing, and walked without assistive device.  Stance was of wide width due to obesity.    Deep tendon reflexes: Upper extremities did show symmetric DTRs. Lower extremity DTRs were symmetric.   Babinski response was deferred.    I would like to thank Larnell Hamilton, MD and Larnell Hamilton, Md [address] [place],  [place] [address] for allowing me to meet with this pleasant patient , who is trying to obtain zepbound for weight loss, .   Justin. Badeaux has been a compliant CPAP user.  He brought his CPAP machine to today's visit and it shows excellent compliance subjectively about a good resolution of the apnea hypopnea index, but he still wakes fatigued he feels sleepy and he endorsed an excessive amount of sleepiness with an Epworth scale of 18 and the fatigue severity scale of 43 points.  His weight now is over 400 pounds and his BMI exceeds 55, this posing multiple health risks including cardiac, cardiovascular, high stroke risk with atrial fibrillation, and wear and tear on his joints. Risk factors for OSA were present,  including : Body mass index is 55.33 kg/m., neck size of over 19 inches and upper airway anatomy with a Mallampati grade 5  There is very persistent hypersomnia while on CPAP.  SABRA   My Plan is to proceed with:  The patient considers himself CPAP dependent he cannot even take a nap without CPAP.  It would be very hard to get him through a baseline study but be probably half to get at least 2 hours of baseline sleep to document the severity of apnea.  My goal would be to have him examined in the sleep lab  with a split-night polysomnography for which I will provide a sleep aid.  The goal is to establish the baseline apnea hypopnea index over the first 2 hours of the night then titrated to CPAP or BiPAP accordingly.  We also can use this time to refit for a mask as his current air leaks are very high.  He also has facial hair which does interfere with his CPAP seal.  So I will ask him to try to closely shave.  Should [place] healthcare not allow us  to do a split-night polysomnography we will have to resort to a home sleep test.  Again the patient has documented atrial fibrillation, had congestive heart failure, ejection fraction last year reportedly between 20 and 25%.  He does have prediabetes and is at high risk of obesity hypoventilation.     I plan to follow up personally or through our NP within 4 months.   A total time of  45  minutes consistent of a part of face to face encounter , exam and interview,  and additional preparation time for chart review was spent .  At today's visit, we discussed treatment options, associated risk and benefits, and engage in counseling as needed including, but not limited to:  Sleep hygiene, Quality Sleep Habits, and Safety concerns for patients with daytime sleepiness who are warned to not operate machinery/ motor vehicles  when drowsy.  Additionally, the following were reviewed: Past medical records, past medical and surgical history, family and social background, as well as relevant laboratory results, imaging findings, and medical notes, where applicable.  This note was generated by myself in part by using dictation software, and as a result, it may contain unintentional typos and errors.  Nevertheless, effort was made to accurately convey the pertinent aspects of the patient's visit.   Dedra Gores, MD   [facility] Sleep Board certified in Sleep Medicine by The [employer] of Sleep Medicine and Diplomate of the [employer] of Sleep Medicine (AASM) . Board certified In Neurology, Diplomat of the ABPN,  Fellow of the [employer] of Neurology.

## 2023-11-12 ENCOUNTER — Telehealth: Payer: Self-pay | Admitting: Neurology

## 2023-11-12 NOTE — Telephone Encounter (Signed)
 Split [facility] pending

## 2023-11-19 NOTE — Telephone Encounter (Signed)
 Split [facility] shara: [id number] (exp. 11/12/23 to 01/27/24)

## 2023-11-19 NOTE — Telephone Encounter (Signed)
 Sent mychart

## 2023-12-13 ENCOUNTER — Other Ambulatory Visit (HOSPITAL_COMMUNITY): Payer: Self-pay | Admitting: Internal Medicine

## 2023-12-29 ENCOUNTER — Ambulatory Visit (INDEPENDENT_AMBULATORY_CARE_PROVIDER_SITE_OTHER): Admitting: Neurology

## 2023-12-29 DIAGNOSIS — E662 Morbid (severe) obesity with alveolar hypoventilation: Secondary | ICD-10-CM

## 2023-12-29 DIAGNOSIS — G4733 Obstructive sleep apnea (adult) (pediatric): Secondary | ICD-10-CM

## 2023-12-29 DIAGNOSIS — I4819 Other persistent atrial fibrillation: Secondary | ICD-10-CM

## 2024-01-13 ENCOUNTER — Ambulatory Visit (HOSPITAL_BASED_OUTPATIENT_CLINIC_OR_DEPARTMENT_OTHER)
Admission: RE | Admit: 2024-01-13 | Discharge: 2024-01-13 | Disposition: A | Discharge status: Home / Self Care | Source: Ambulatory Visit | Attending: Nurse Practitioner | Admitting: Nurse Practitioner

## 2024-01-13 ENCOUNTER — Other Ambulatory Visit (HOSPITAL_COMMUNITY): Payer: Self-pay | Admitting: Nurse Practitioner

## 2024-01-13 DIAGNOSIS — R10A2 Flank pain, left side: Secondary | ICD-10-CM | POA: Diagnosis present

## 2024-01-14 ENCOUNTER — Ambulatory Visit: Payer: Self-pay | Admitting: Neurology

## 2024-01-14 NOTE — Procedures (Signed)
 [facility]   SPLIT PROTOCOL- SLEEP STUDY REPORT STUDY DATE: 12/29/2023     PATIENT NAME:  Justin Perez         DATE OF BIRTH:  [birth year]  PATIENT ID:  [id number]    TYPE OF STUDY:  SPLIT   PHYSICIAN: DEDRA GORES, MD  SCORING TECHNICIAN: Donnice Counts   INDICATIONS:  This 48 year-old Male patient of Dr Pietro and Dr Larnell presents with a history of OSA on CPAP since 2017 ( original dx was made at [facility] ) . The patient needs a new machine in 2025 and also a baseline AHI to see if he can qualify for weight loss medication ( FDA approved Zepbound).  The Epworth Sleepiness Scale : 18/24 (scores above or equal to 10 are suggestive of hypersomnolence).  DESCRIPTION: A sleep technologist was in attendance for the duration of the recording.  Data collection, scoring, video monitoring, and reporting were performed in compliance with the AASM Manual for the Scoring of Sleep and Associated Events; (Hypopnea is scored based on the criteria listed in Section VIII D. 1b in the AASM Manual V2.6 using a 4% oxygen desaturation rule or Hypopnea is scored based on the criteria listed in Section VIII D. 1a in the AASM Manual V2.6 using 3% oxygen desaturation and /or arousal rule).  A physician certified by the American Board of Sleep Medicine reviewed each epoch of the study.  ADDITIONAL INFORMATION:  Height: 72.0 in Weight: 408 lb (BMI 55) Neck Size: 23.0 in Medications: Tylonol, Tikosyn , Eliquis , Jardiance , Pepcid , Lasix , Cozaar , Mag-Ox, Toprol  XL   STUDY DETAILS: Lights off was at 21:52: and lights on 04:53: (421 minutes hours in bed). This study was performed with an initial diagnostic portion followed by positive airway pressure titration.  Part ONE /DIAGNOSTIC  SLEEP CONTINUITY AND SLEEP ARCHITECTURE:  The diagnostic portion of the study began at 21:52 and ended at 00:24, for a recording time of 2h 32.17m minutes.  Total sleep time was 122 minutes  (50.0% supine;  50.0% lateral;  0.0% prone, 8.2% REM sleep), with a decreased sleep efficiency at 80.0%.  Sleep latency was at 6.5 minutes.  REM sleep latency was increased at 125.0 minutes.  . Of the total sleep time, the percentage of stage N1 sleep was 14.8%, stage N2 sleep was 77.0%, stage N3 sleep was 0.0%, and REM sleep was 8.2%.  There were 1 Stage R periods observed during this portion of the study, 9 awakenings (i.e. transitions to Stage W from any sleep stage), and 36.0 total stage transitions.  Wake after sleep onset (WASO) time accounted for 24 minutes.   AROUSAL (Baseline): There were 31.4.0 arousals/hour.  Of these, 51.0 were identified as respiratory-related arousals (25.1 /hr), 0 were PLM-related arousals (0.0 /hr), and 13 were non-specific arousals (6.4 /hr)   RESPIRATORY MONITORING: There were 0 respiratory effort-related arousals (RERAs).   Based on AASM criteria (using a 3% oxygen desaturation and /or arousal rule for scoring hypopneas),  there were 51 apneas (45 obstructive; 6 central; 0 mixed), and 140 hypopneas.   Apnea index was 24.1/h.  Hypopnea index was 62.0/h.  The AHI (apnea-hypopnea index) was 86.1/h   (55.1/h AHI supine; 90.0/h AHI in REM,  86/h AHI in NREM). LIMB MOVEMENTS: There were 0 periodic limb movements of sleep (0.0/hr), of which 0 (0.0/hr) were associated with an arousal.   OXIMETRY: Respiratory events were associated with oxyhemoglobin desaturations to a nadir of 66% from a baseline mean  saturation of 94%. Total time spent below 89% was 20.3 minutes, or 16.7%  of total sleep time.  SABRA   BODY POSITION: Duration of total sleep and percent of total sleep in their respective position is as follows: supine 61 minutes minutes (50.0%), non-supine 61.0 minutes (50.0%); right 00 minutes minutes (0.0%), left 61 minutes minutes (50.0%), and prone 00 minutes minutes (0.0%). Total supine REM sleep time was 00 minutes minutes (0.0% of total REM sleep).   EKG: Highest  heart rate for the baseline portion of the study was 65.0 beats per minute. The underlying rhythm was regular with isolated PVCs at about 1 per 60 seconds- these were more frequent in REM sleep.     The average heart rate during sleep was 51 bpm, while the highest heart rate for the same period was 63 bpm.   Snoring was loud.     PART TWO : TREATMENT  SLEEP CONTINUITY AND SLEEP ARCHITECTURE: The patient chose a FFM for the study, a AirFit F 40 in medium, and explored CPAP pressures between 5 and 16 cm water, all in supine sleep.    The treatment portion of the study began at 00:24 and ended at 04:53, for a titration recording time of 4h 28.46m minutes.   Total sleep time was 222 minutes minutes (100.0% in supine ), with a sleep efficiency at 83%.  Sleep latency was brief at 4.0 minutes. REM sleep latency was 74.5 minutes.   Arousal index was 12.4 /hr. Of the total sleep time, the percentage of stage N1 sleep was 5.6%, stage N3 sleep was 0.0%, , now with 21.8% REM sleep. There were 3 Stage R periods observed during this portion of the study, 7 awakenings (i.e. transitions to Stage W from any sleep stage), and 40.0 total stage transitions.  Wake after sleep onset (WASO) time accounted for 41 minutes.   AROUSAL: There were 11.6 arousals/h.  Of these, 15.0 were identified as respiratory-related arousals (4.0 /hr), 0 were PLM-related arousals (0.0 /hr), and 28 were non-specific arousals (7.6 /hr)  RESPIRATORY MONITORING:  While on PAP therapy, based on AASM criteria, the apnea-hypopnea index was 11.9 overall ( 2.2/h in REM). The Apnea index was : 0.5/h ( only 2 central apneas were now noted, very few- and in NREM sleep).   The hypopneas index was: 11.3 /h.  34 hypopneas  in NREM sleep and 8 in REM sleep.     LIMB MOVEMENTS: There were 0 periodic limb movements of sleep .   OXIMETRY: Total sleep time spent at, or below 88% was 2.0 minutes, or 0.9% of total sleep time. Respiratory events were  associated with oxyhemoglobin desaturation (nadir 83.0%) from a mean of 96.0%.  Total time spent below 89% was 2.4 minutes, or 1.1%  of total sleep time.   Snoring was controlled at final pressure.   BODY POSITION : 100 % supine. Total supine REM sleep time was 48 minutes minutes (100.0% of total REM sleep).   EKG:  Regular cardiac rhythm with isolated PVCs.  The average heart rate during sleep was 50 bpm.  The maximum heart rate during sleep was 65 bpm.        IMPRESSION: Severe obstructive sleep apnea was documented with the diagnostic part one of the SPLIT protocol, which would qualify the patient by FDA criteria approving the use of weight loss medication. The diagnostic part was  followed  by a CPAP titration that allowed REM sleep rebound after  13 cm water were reached.  The lowest AHI was 4.7/h under the last explored pressure setting of 16 cm water.  The highest sleep efficiency was seen under 13 cm water  pressure , associated with an AHI of 10.3/h. No EPR ( expiratory pressure reduction ) was used.       RECOMMENDATIONS: The patient's diagnoses of severe obstructive sleep apnea with hypoxia was confirmed and successfully treated at 13, 14 and 15 cm water pressure CPAP,  allowing the patient to experience REM sleep, and to sleep longer and feel more refreshed than usual.  The mask also did control air leaks better than his home set up.    My prescription will read : DME to provide a ResMed auto-titrating CPAP device with a setting from 7 through 17 cm water, 2 cm EPR and an AirFit medium sized ResMed FF-Mask , with heated humidification.    DEDRA GORES,  MD    DIAGNOSES: OSA, severe.  Sleep hypoxia associated with OSA.          General Information  Name: Justin Perez, Justin Perez BMI: 44 Physician: Dedra Gores, MD  ID: [id number] Height: 72 in Technician: Sheena Cough  Sex: Male Weight: 408 lb Record: [id number]  Age: 78 [21-Nov-1975] Date: 12/29/2023 Scorer: Cough Sheena   Recommended Settings IPAP: N/A cmH20 EPAP: N/A cmH2O AHI: N/A AHI (4%): N/A   Pressure IPAP/EPAP 00 05 07 09 11 13 15 16    O2 Vol 0.0 0.0 0.0 0.0 0.0 0.0 0.0 0.0  Time TRT 152.82m 15.18m 14.22m 17.60m 20.35m 31.68m 120.33m 50.32m   TST 122.98m 11.13m 14.63m 17.44m 20.33m 29.15m 93.50m 38.76m  Sleep Stage % Wake 20.0 25.8 0.0 0.0 0.0 6.5 22.8 24.0   % REM 8.2 0.0 0.0 0.0 0.0 67.2 21.5 23.7   % N1 14.8 13.0 3.4 0.0 0.0 6.9 2.7 15.8   % N2 77.0 87.0 96.6 100.0 100.0 25.9 75.8 59.2   % N3 0.0 0.0 0.0 0.0 0.0 0.0 0.0 0.0  Respiratory Total Events 175 20 1 5 2 5 8 3    Obs. Apn. 45 0 0 0 0 0 0 0   Mixed Apn. 0 0 0 0 0 0 0 0   Cen. Apn. 4 0 0 0 0 0 2 0   Hypopneas 126 20 1 5 2 5 6 3    AHI 86.07 104.35 4.14 17.65 6.00 10.34 5.16 4.74   Supine AHI 110.16 104.35 4.14 17.65 6.00 10.34 5.16 4.74   Prone AHI 0.00 0.00 0.00 0.00 0.00 0.00 0.00 0.00   Side AHI 61.97 0.00 0.00 0.00 0.00 0.00 0.00 0.00  Respiratory (4%) Hypopneas (4%) 112.00 18.00 1.00 0.00 0.00 4.00 3.00 2.00   AHI (4%) 79.18 93.91 4.14 0.00 0.00 8.28 3.23 3.16   Supine AHI (4%) 105.25 93.91 4.14 0.00 0.00 8.28 3.23 3.16   Prone AHI (4%) 0.00 0.00 0.00 0.00 0.00 0.00 0.00 0.00   Side AHI (4%) 53.11 0.00 0.00 0.00 0.00 0.00 0.00 0.00  Desat Profile <= 90% 49.26m 0.60m 0.73m 0.20m 0.65m 3.22m 2.42m 0.16m   <= 80% 0.31m 0.21m 0.58m 0.56m 0.6m 0.46m 0.13m 0.63m   <= 70% 0.44m 0.24m 0.56m 0.73m 0.78m 0.48m 0.43m 0.32m   <= 60% 0.67m 0.60m 0.67m 0.7m 0.28m 0.84m 0.26m 0.47m  Arousal Index Apnea 14.3 0.0 0.0 0.0 0.0 0.0 0.6 0.0   Hypopnea 10.8 62.6 0.0 0.0 0.0 4.1 0.0 0.0   LM 0.0 0.0 0.0 0.0 0.0 0.0 1.9 0.0   Spontaneous 6.4 10.4 8.3 28.2 15.0 4.1 1.3 11.1

## 2024-01-19 NOTE — Telephone Encounter (Signed)
 I called pt relayed sleep study results.  Severe osa. Autopap ordered.  DME [facility] will authorize with insurance then call pt.  Then order machine have him come in for machine/supplies, mask.  Use 4 hrs or more every night for insurance compliance.  See in office for initial cpap f/u 2-3 months after gets machine.  Pt to call and schedule.  Relayed time sensitive appt.  He verbalized understanding.

## 2024-01-19 NOTE — Telephone Encounter (Signed)
-----   Message from Prospect Dohmeier sent at 01/14/2024 11:05 AM EST ----- Baseline AHI (apnea-hypopnea index) was 86.1/h   - severe OSA , which qualifies for Zepbound use by FDA criteria( but may still not be covered by insurance).  The following CPAP titration was successful:   DME order processed  ( ResMed auto-titrating CPAP device with a setting from 7 through 17 cm water, 2 cm EPR and an AirFit medium sized ResMed FF-Mask , with heated humidification).     ----- Message ----- From: Rebecka Fleeta Higashi In One Three One Sent: 01/14/2024  10:37 AM EST To: Dedra Gores, MD

## 2024-03-18 ENCOUNTER — Other Ambulatory Visit (HOSPITAL_COMMUNITY): Payer: Self-pay | Admitting: *Deleted

## 2024-03-18 MED ORDER — MAGNESIUM OXIDE -MG SUPPLEMENT 400 (240 MG) MG PO TABS: 400.0000 mg | ORAL_TABLET | Freq: Every day | ORAL | 2 refills | Status: DC

## 2024-03-21 ENCOUNTER — Other Ambulatory Visit (HOSPITAL_COMMUNITY): Payer: Self-pay | Admitting: *Deleted

## 2024-03-21 MED ORDER — MAGNESIUM OXIDE -MG SUPPLEMENT 400 (240 MG) MG PO TABS: 400.0000 mg | ORAL_TABLET | Freq: Every day | ORAL | 3 refills | Status: AC

## 2024-03-25 ENCOUNTER — Ambulatory Visit (HOSPITAL_COMMUNITY): Admitting: Internal Medicine

## 2024-04-01 ENCOUNTER — Ambulatory Visit (HOSPITAL_COMMUNITY): Admitting: Internal Medicine
